# Patient Record
Sex: Female | Born: 1965 | Race: White | Hispanic: No | Marital: Married | Smoking: Former smoker
Health system: Southern US, Community
[De-identification: ages and names within clinical notes are randomized; demographics above are authoritative.]

## PROBLEM LIST (undated history)

## (undated) DIAGNOSIS — N390 Urinary tract infection, site not specified: Secondary | ICD-10-CM

## (undated) DIAGNOSIS — B019 Varicella without complication: Secondary | ICD-10-CM

## (undated) DIAGNOSIS — J302 Other seasonal allergic rhinitis: Secondary | ICD-10-CM

## (undated) DIAGNOSIS — K9041 Non-celiac gluten sensitivity: Secondary | ICD-10-CM

## (undated) DIAGNOSIS — E785 Hyperlipidemia, unspecified: Secondary | ICD-10-CM

## (undated) DIAGNOSIS — J45909 Unspecified asthma, uncomplicated: Secondary | ICD-10-CM

## (undated) DIAGNOSIS — E739 Lactose intolerance, unspecified: Secondary | ICD-10-CM

## (undated) DIAGNOSIS — R002 Palpitations: Secondary | ICD-10-CM

## (undated) DIAGNOSIS — T7840XA Allergy, unspecified, initial encounter: Secondary | ICD-10-CM

## (undated) DIAGNOSIS — R9431 Abnormal electrocardiogram [ECG] [EKG]: Secondary | ICD-10-CM

## (undated) HISTORY — DX: Abnormal electrocardiogram (ECG) (EKG): R94.31

## (undated) HISTORY — DX: Varicella without complication: B01.9

## (undated) HISTORY — DX: Hyperlipidemia, unspecified: E78.5

## (undated) HISTORY — DX: Non-celiac gluten sensitivity: K90.41

## (undated) HISTORY — DX: Urinary tract infection, site not specified: N39.0

## (undated) HISTORY — DX: Other seasonal allergic rhinitis: J30.2

## (undated) HISTORY — PX: ABDOMINAL HYSTERECTOMY: SHX81

## (undated) HISTORY — DX: Unspecified asthma, uncomplicated: J45.909

## (undated) HISTORY — DX: Allergy, unspecified, initial encounter: T78.40XA

## (undated) HISTORY — PX: LAPAROSCOPIC APPENDECTOMY: SUR753

## (undated) HISTORY — DX: Lactose intolerance, unspecified: E73.9

## (undated) HISTORY — DX: Palpitations: R00.2

## (undated) HISTORY — PX: LIPOMA EXCISION: SHX5283

---

## 1995-02-09 HISTORY — PX: ABDOMINAL HYSTERECTOMY: SHX81

## 1997-02-08 HISTORY — PX: LAPAROSCOPIC APPENDECTOMY: SUR753

## 2005-05-17 ENCOUNTER — Emergency Department (HOSPITAL_COMMUNITY): Admission: EM | Admit: 2005-05-17 | Discharge: 2005-05-17 | Payer: Self-pay | Admitting: Emergency Medicine

## 2005-05-18 ENCOUNTER — Emergency Department (HOSPITAL_COMMUNITY): Admission: EM | Admit: 2005-05-18 | Discharge: 2005-05-18 | Payer: Self-pay | Admitting: Emergency Medicine

## 2008-05-11 ENCOUNTER — Emergency Department (HOSPITAL_COMMUNITY): Admission: EM | Admit: 2008-05-11 | Discharge: 2008-05-11 | Payer: Self-pay | Admitting: Emergency Medicine

## 2009-01-11 ENCOUNTER — Emergency Department (HOSPITAL_COMMUNITY): Admission: EM | Admit: 2009-01-11 | Discharge: 2009-01-11 | Payer: Self-pay | Admitting: Emergency Medicine

## 2009-12-19 ENCOUNTER — Encounter (INDEPENDENT_AMBULATORY_CARE_PROVIDER_SITE_OTHER): Payer: Self-pay | Admitting: *Deleted

## 2009-12-22 ENCOUNTER — Encounter: Payer: Self-pay | Admitting: Cardiology

## 2010-03-01 ENCOUNTER — Encounter: Payer: Self-pay | Admitting: Otolaryngology

## 2010-03-10 NOTE — Letter (Signed)
Summary: Appointment - Missed  [facility] [place]  [address], [place] [address]   Phone: [phone]  Fax: [phone]     December 19, 2009 MRN: [id number]   JAKALA HERFORD [address] [place], [place]  [address]   Dear Ms. HARNEY,  Our records indicate you missed your appointment on      12/19/09                  with Dr.    Diona Browner         It is very important that we reach you to reschedule this appointment. We look forward to participating in your health care needs. Please contact us at the number listed above at your earliest convenience to reschedule this appointment.     Sincerely,    Glass blower/designer

## 2010-03-10 NOTE — Letter (Signed)
Summary: ,[facility] RECORDS  ,[facility]   Imported By: Faythe Ghee 12/22/2009 09:51:17  _____________________________________________________________________  External Attachment:    Type:   Image     Comment:   External Document

## 2010-05-12 LAB — COMPREHENSIVE METABOLIC PANEL
AST: 24 U/L (ref 0–37)
GFR calc Af Amer: >60 mL/min (ref >60)
Sodium: 138 mEq/L (ref 135–145)
Total Protein: 6.7 g/dL (ref 6.0–8.3)

## 2010-05-12 LAB — CBC
HCT: 38.5 % (ref 36.0–46.0)
Hemoglobin: 13.5 g/dL (ref 12.0–15.0)
MCHC: 35.1 g/dL (ref 30.0–36.0)
MCV: 89.3 fL (ref 78.0–100.0)
Platelets: 192 10*3/uL (ref 150–400)
RBC: 4.31 MIL/uL (ref 3.87–5.11)
RDW: 12.2 % (ref 11.5–15.5)
WBC: 6.4 10*3/uL (ref 4.0–10.5)

## 2010-05-12 LAB — DIFFERENTIAL
Basophils Absolute: 0 10*3/uL (ref 0.0–0.1)
Basophils Relative: 1 % (ref 0–1)
Eosinophils Absolute: 0.1 10*3/uL (ref 0.0–0.7)
Eosinophils Relative: 2 % (ref 0–5)
Lymphocytes Relative: 40 % (ref 12–46)
Lymphs Abs: 2.5 10*3/uL (ref 0.7–4.0)
Monocytes Absolute: 0.4 10*3/uL (ref 0.1–1.0)
Monocytes Relative: 6 % (ref 3–12)
Neutro Abs: 3.3 10*3/uL (ref 1.7–7.7)
Neutrophils Relative %: 52 % (ref 43–77)

## 2010-05-12 LAB — URINALYSIS, ROUTINE W REFLEX MICROSCOPIC
Glucose, UA: NEGATIVE mg/dL
Hgb urine dipstick: NEGATIVE
pH: 6.5 (ref 5.0–8.0)

## 2011-05-11 ENCOUNTER — Emergency Department (HOSPITAL_COMMUNITY)
Admission: EM | Admit: 2011-05-11 | Discharge: 2011-05-11 | Disposition: A | Discharge status: Home / Self Care | Payer: BC Managed Care – PPO | Attending: Emergency Medicine | Admitting: Emergency Medicine

## 2011-05-11 ENCOUNTER — Emergency Department (HOSPITAL_COMMUNITY): Payer: BC Managed Care – PPO

## 2011-05-11 ENCOUNTER — Other Ambulatory Visit: Payer: Self-pay

## 2011-05-11 ENCOUNTER — Encounter (HOSPITAL_COMMUNITY): Payer: Self-pay

## 2011-05-11 DIAGNOSIS — R002 Palpitations: Secondary | ICD-10-CM | POA: Insufficient documentation

## 2011-05-11 DIAGNOSIS — R9431 Abnormal electrocardiogram [ECG] [EKG]: Secondary | ICD-10-CM | POA: Insufficient documentation

## 2011-05-11 LAB — POCT I-STAT, CHEM 8
BUN: 19 mg/dL (ref 6–23)
Chloride: 105 mEq/L (ref 96–112)
Creatinine, Ser: 0.8 mg/dL (ref 0.50–1.10)
Potassium: 3.9 mEq/L (ref 3.5–5.1)
Sodium: 141 mEq/L (ref 135–145)
TCO2: 24 mmol/L (ref 0–100)

## 2011-05-11 LAB — POCT I-STAT TROPONIN I: Troponin i, poc: 0 ng/mL (ref 0.00–0.08)

## 2011-05-11 NOTE — ED Provider Notes (Signed)
History   This chart was scribed for Diana Quarry, MD by Clarita Crane. The patient was seen in room APA05/APA05. Patient's care was started at 1131.    CSN: [id number]  Arrival date & time 05/11/11  1131   First MD Initiated Contact with Patient 05/11/11 1241      Chief Complaint  Patient presents with  . Palpitations    (Consider location/radiation/quality/duration/timing/severity/associated sxs/prior treatment) HPI MEE Diana Bryant is a 46 y.o. female who presents to the Emergency Department after referral from PCP complaining of intermittent episodes of moderate palpitations described as fluttering/rapid heart beat onset several years ago and gradually worsening the past several weeks with associated SOB with episodes. Patient notes she has experienced infrequent episodes of chest pain over the past several weeks but notes this has been related with increased stress. Denies swelling of lower extremities, diaphoresis, weight loss/gain. Denies h/o diabetes, HTN and is a non smoker, non-drinker. Denies family h/o heart problems.   Past Medical History  Diagnosis Date  . Irregular heart beat     Past Surgical History  Procedure Date  . Abdominal hysterectomy     No family history on file.  History  Substance Use Topics  . Smoking status: Not on file  . Smokeless tobacco: Not on file  . Alcohol Use: No    OB History    Grav Para Term Preterm Abortions TAB SAB Ect Mult Living                  Review of Systems 10 Systems reviewed and all are negative for acute change except as noted in the HPI.   Allergies  Amoxicillin; Oxycodone-acetaminophen; and Sulfonamide derivatives  Home Medications  No current outpatient prescriptions on file.  BP 117/69  Pulse 84  Temp(Src) 98.6 F (37 C) (Oral)  Resp 16  SpO2 100%  Physical Exam  Nursing note and vitals reviewed. Constitutional: She is oriented to person, place, and time. She appears well-developed and  well-nourished. No distress.  HENT:  Head: Normocephalic and atraumatic.  Eyes: EOM are normal. Pupils are equal, round, and reactive to light.  Neck: Neck supple. No tracheal deviation present.  Cardiovascular: Normal rate and regular rhythm.  Exam reveals no gallop and no friction rub.   No murmur heard. Pulmonary/Chest: Effort normal. No respiratory distress. She has no wheezes. She has no rales.  Abdominal: Soft. She exhibits no distension.  Musculoskeletal: Normal range of motion. She exhibits no edema.  Neurological: She is alert and oriented to person, place, and time. No sensory deficit.  Skin: Skin is warm and dry.  Psychiatric: She has a normal mood and affect. Her behavior is normal.    ED Course  Procedures (including critical care time)  DIAGNOSTIC STUDIES: Oxygen Saturation is 100% on room air, normal by my interpretation.    COORDINATION OF CARE: 12:55PM- Patient informed of current plan for treatment and evaluation and agrees with plan at this time. Will refer to cardiologist.     Labs Reviewed  POCT I-STAT, CHEM 8 - Abnormal; Notable for the following:    Glucose, Bld 110 (*)    All other components within normal limits  POCT I-STAT TROPONIN I   Dg Chest 2 View  05/11/2011  *RADIOLOGY REPORT*  Clinical Data: Palpitations  CHEST - 2 VIEW  Comparison: 01/11/2009  Findings: Cardiomediastinal silhouette is stable.  No acute infiltrate or pleural effusion.  No pulmonary edema.  Bony thorax is stable.  IMPRESSION: No active disease.  No significant change.  Original Report Authenticated By: Natasha Mead, M.D.     1. Palpitations   2. Prolonged Q-T interval on ECG       MDM  I personally performed the services described in this documentation, which was scribed in my presence. The recorded information has been reviewed and considered.   Patient with palpitations but no signs or symptoms of cad.  Patient advised regarding prolonged qtc and advised close follow up  with cardiology.  She voiced understanding and agreement with plan.        Diana Quarry, MD 05/18/11 [phone]

## 2011-05-11 NOTE — Discharge Instructions (Signed)
Your QT is slightly prolonged at 481 on your EKG. This should be followed up by the cardiologist. Otherwise her workup is entirely normal. You need to see Dr. Charm Barges and have your thyroid screening. Please avoid all medications that are not prescribed and avoid all caffeine and other stimulants.

## 2011-05-11 NOTE — ED Notes (Signed)
Complain of palpations for a long time. States she spoke with her doctor and was told to come here for a referral

## 2011-05-27 ENCOUNTER — Encounter: Payer: Self-pay | Admitting: Cardiology

## 2011-05-27 ENCOUNTER — Ambulatory Visit (INDEPENDENT_AMBULATORY_CARE_PROVIDER_SITE_OTHER): Payer: BC Managed Care – PPO | Admitting: Cardiology

## 2011-05-27 VITALS — BP 117/80 | HR 72 | Ht 62.0 in | Wt 166.0 lb

## 2011-05-27 DIAGNOSIS — Z0189 Encounter for other specified special examinations: Secondary | ICD-10-CM | POA: Insufficient documentation

## 2011-05-27 DIAGNOSIS — R002 Palpitations: Secondary | ICD-10-CM | POA: Insufficient documentation

## 2011-05-27 DIAGNOSIS — R9431 Abnormal electrocardiogram [ECG] [EKG]: Secondary | ICD-10-CM | POA: Insufficient documentation

## 2011-05-27 NOTE — Patient Instructions (Addendum)
Your physician recommends that you schedule a follow-up appointment in: 3 weeks  Your physician has recommended that you wear a holter monitor. Holter monitors are medical devices that record the heart's electrical activity. Doctors most often use these monitors to diagnose arrhythmias. Arrhythmias are problems with the speed or rhythm of the heartbeat. The monitor is a small, portable device. You can wear one while you do your normal daily activities. This is usually used to diagnose what is causing palpitations/syncope (passing out).

## 2011-05-27 NOTE — Assessment & Plan Note (Signed)
Repeat EKG shows a perfectly normal QT interval.  In the absence of suggestive symptoms, a definite family history, arrhythmia or testing abnormalities, no further evaluation or treatment of this issue is warranted.

## 2011-05-27 NOTE — Progress Notes (Deleted)
Name: Diana Bryant    DOB: Jun 10, 1965  Age: 46 y.o.  MR#: [id number]       PCP:  Alcide Evener, DO      Insurance: @PAYORNAME @   CC:    Chief Complaint  Patient presents with  . Palpitations    Referral from ED for Abn EKG/Palps - No meds    VS BP 117/80  Pulse 72  Ht 5\' 2"  (1.575 m)  Wt 166 lb (75.297 kg)  BMI 30.36 kg/m2  Weights Current Weight  05/27/11 166 lb (75.297 kg)    Blood Pressure  BP Readings from Last 3 Encounters:  05/27/11 117/80  05/11/11 117/69     Admit date:  (Not on file) Last encounter with RMR:  Visit date not found   Allergy Allergies  Allergen Reactions  . Amoxicillin   . Oxycodone-Acetaminophen   . Sulfonamide Derivatives     Current Outpatient Prescriptions  Medication Sig Dispense Refill  . cetirizine (ZYRTEC) 10 MG tablet Take 10 mg by mouth daily.      Marland Kitchen NASONEX 50 MCG/ACT nasal spray prn      . NON FORMULARY CANDIZYME (powerful multi-enzyme formula) 1 tab po qd      . NON FORMULARY XCLEAR (SINUS NASAL SPRAY)        Discontinued Meds:   There are no discontinued medications.  There is no problem list on file for this patient.   LABS Admission on 05/11/2011, Discharged on 05/11/2011  Component Date Value  . Sodium 05/11/2011 141   . Potassium 05/11/2011 3.9   . Chloride 05/11/2011 105   . BUN 05/11/2011 19   . Creatinine, Ser 05/11/2011 0.80   . Glucose, Bld 05/11/2011 110*  . Calcium, Ion 05/11/2011 1.25   . TCO2 05/11/2011 24   . Hemoglobin 05/11/2011 13.6   . HCT 05/11/2011 40.0   . Troponin i, poc 05/11/2011 0.00   . Comment 3 05/11/2011             Results for this Opt Visit:     Results for orders placed during the hospital encounter of 05/11/11  POCT I-STAT, CHEM 8      Component Value Range   Sodium 141  135 - 145 (mEq/L)   Potassium 3.9  3.5 - 5.1 (mEq/L)   Chloride 105  96 - 112 (mEq/L)   BUN 19  6 - 23 (mg/dL)   Creatinine, Ser 4.09  0.50 - 1.10 (mg/dL)   Glucose, Bld 811 (*) 70 - 99  (mg/dL)   Calcium, Ion 9.14  7.82 - 1.32 (mmol/L)   TCO2 24  0 - 100 (mmol/L)   Hemoglobin 13.6  12.0 - 15.0 (g/dL)   HCT 95.6  21.3 - 08.6 (%)  POCT I-STAT TROPONIN I      Component Value Range   Troponin i, poc 0.00  0.00 - 0.08 (ng/mL)   Comment 3             EKG Orders placed during the hospital encounter of 05/11/11  . ED EKG  . ED EKG  . EKG     Prior Assessment and Plan    Imaging: Dg Chest 2 View  05/11/2011  *RADIOLOGY REPORT*  Clinical Data: Palpitations  CHEST - 2 VIEW  Comparison: 01/11/2009  Findings: Cardiomediastinal silhouette is stable.  No acute infiltrate or pleural effusion.  No pulmonary edema.  Bony thorax is stable.  IMPRESSION: No active disease.  No significant change.  Original Report Authenticated By: Lang Snow  POP, M.D.     [facility] Calculation: Score not calculated. Missing: Total Cholesterol, HDL

## 2011-05-27 NOTE — Progress Notes (Signed)
Patient ID: Diana Bryant, female   DOB: 04-28-1965, 46 y.o.   MRN: [id number]  HPI: Diana Bryant is evaluated at the kind request of Dr. Lodema Hong after assessment in the emergency department for palpitations revealed the presence of a long QT IInterval.   There is no family history of cardiac problems, sudden death, EKG abnormalities or arrhythmias; however,  Diana Bryant is her only first degree relative.  She is not in touch with her Diana Bryant nor any of his family and has one half sibling.  She has never suffered loss of consciousness, not previously undergone cardiology assessment or testing and has never been seen by a cardiologist.  She presented to the emergency department with palpitations at the request of her Diana Bryant, who is an Charity fundraiser, and was found to have a long QT interval of approximately 480 ms corrected.  Apparently, no arrhythmias were documented.  Patient reports daily palpitations are generally quite brief and not associated with other symptoms.  She does note class II dyspnea on exertion, which has been present for the past year or 2.  Current Outpatient Prescriptions on File Prior to Visit  Medication Sig Dispense Refill  . cetirizine (ZYRTEC) 10 MG tablet Take 10 mg by mouth daily.      Marland Kitchen NASONEX 50 MCG/ACT nasal spray prn       Allergies  Allergen Reactions  . Amoxicillin   . Oxycodone-Acetaminophen   . Sulfonamide Derivatives     Past Medical History  Diagnosis Date  . Palpitations     05/2011-normal CBC, normal BMet, normal EKG except borderline QT prolongation and  . Abnormal EKG     Past Surgical History  Procedure Date  . Abdominal hysterectomy     History reviewed. No pertinent family history.   History   Social History  . Marital Status: Married    Spouse Name: N/A    Number of Children: N/A  . Years of Education: N/A   Occupational History  . Not on file.   Social History Main Topics  . Smoking status: Former Games developer  . Smokeless tobacco: Not on file  .  Alcohol Use: No  . Drug Use: No  . Sexually Active: Not on file   Other Topics Concern  . Not on file   Social History Narrative  . No narrative on file    ROS: Consumption of caffeine is modest.  Minimal tobacco use it was discontinued 2 years ago, notes what appears to be excessive hair loss, intermittent sinus problems, nasal congestion, generalized weakness and fatigue.     All other systems reviewed and are negative.  PHYSICAL EXAM: BP 117/80  Pulse 72  Ht 5\' 2"  (1.575 m)  Wt 75.297 kg (166 lb)  BMI 30.36 kg/m2  General-Well-developed; no acute distress Body Habitus-Overweight HEENT-[place]/AT; PERRL; EOM intact; conjunctiva and lids nl Neck-No JVD; no carotid bruits Endocrine-No thyromegaly Lungs-Clear lung fields; resonant percussion; normal I-to-E ratio Cardiovascular- normal PMI; normal S1 and S2 Abdomen-BS normal; soft and non-tender without masses or organomegaly Musculoskeletal-No deformities, cyanosis or clubbing Neurologic-Nl cranial nerves; symmetric strength and tone Skin- Warm, no significant lesions Extremities-Nl distal pulses; no edema  EKG:  Normal sinus rhythm, minimal nondiagnostic inferolateral Q waves, otherwise normal.  Corrected QT interval is less than 450 ms.  ASSESSMENT AND PLAN:  Vallecito Bing, MD 05/27/2011 3:49 PM

## 2011-05-30 NOTE — Assessment & Plan Note (Signed)
Since symptoms are occurring on a daily or near daily basis, a 48 hour Holter recording should be adequate to document their identity.  I will reassess this nice woman once the results of that study are available.

## 2011-05-31 ENCOUNTER — Ambulatory Visit (HOSPITAL_COMMUNITY)
Admission: RE | Admit: 2011-05-31 | Discharge: 2011-05-31 | Disposition: A | Discharge status: Home / Self Care | Payer: BC Managed Care – PPO | Source: Ambulatory Visit | Attending: Cardiology | Admitting: Cardiology

## 2011-05-31 DIAGNOSIS — R002 Palpitations: Secondary | ICD-10-CM | POA: Insufficient documentation

## 2011-05-31 NOTE — Progress Notes (Signed)
*  PRELIMINARY RESULTS* Echocardiogram 48H Holter monitor has been performed.  Conrad Ravenna 05/31/2011, 10:25 AM

## 2011-06-05 ENCOUNTER — Encounter: Payer: Self-pay | Admitting: Cardiology

## 2011-06-05 NOTE — Procedures (Signed)
Diana Bryant, Diana Bryant               ACCOUNT NO.:  [id number]  MEDICAL RECORD NO.:  [id number]  LOCATION:  CARDIOPU                      FACILITY:  [facility]  PHYSICIAN:  Gerrit Friends. Dietrich Pates, MD, FACCDATE OF BIRTH:  [birth year]  DATE OF PROCEDURE:  05/31/2011 DATE OF DISCHARGE:  05/31/2011                               HOLTER MONITOR   REFERRING PHYSICIAN:  Samuel Jester, MD  CLINICAL DATA:  47 year old woman with palpitations. 1. Continuous electrocardiographic recording was maintained for 47     hours and 52 minutes, during which the predominant rhythm was     normal sinus.  Minimal sinus tachycardia to a peak heart rate of     113 BPM and modest sinus bradycardia to a nadir of 50 BPM were also identified.  First-degree AV block was present. 2. Extremely rare PVCs were identified with a total of 2 noted during     the entire recording interval.  A similar number of     supraventricular ectopic complexes were recorded, numbering 19 over     the entire record.  No significant arrhythmias identified. 3. No significant ST-segment elevation nor depression seen. 4. A detail diary of activity was returned reporting 4 episodes of     palpitations.  During each of these, normal sinus rhythm with first-     degree AV block was maintained.  IMPRESSION:  Negative Holter monitor recording revealing no significant arrhythmias and no correlation between rhythm and symptoms.     Gerrit Friends. Dietrich Pates, MD, [facility]     RMR/MEDQ  D:  06/05/2011  T:  06/05/2011  Job:  [id number]

## 2011-06-18 ENCOUNTER — Encounter: Payer: Self-pay | Admitting: Cardiology

## 2011-06-18 ENCOUNTER — Ambulatory Visit (INDEPENDENT_AMBULATORY_CARE_PROVIDER_SITE_OTHER): Payer: BC Managed Care – PPO | Admitting: Cardiology

## 2011-06-18 VITALS — BP 128/80 | HR 63 | Resp 16 | Ht 62.0 in | Wt 167.0 lb

## 2011-06-18 DIAGNOSIS — R002 Palpitations: Secondary | ICD-10-CM

## 2011-06-18 DIAGNOSIS — R9431 Abnormal electrocardiogram [ECG] [EKG]: Secondary | ICD-10-CM

## 2011-06-18 NOTE — Progress Notes (Signed)
Patient ID: Diana Bryant, female   DOB: [birth year], 46 y.o.   MRN: [id number]  HPI: Scheduled return visit for this very nice woman with a history of palpitations and a question of long QT interval.  Since her last visit, symptoms are unchanged.  She notes occasional brief palpitations but she does not find aversive and for which she is not inclined to utilize pharmacologic therapy.  Exercise tolerance remains good.  She attempted to locate an EKG for her mother, but was unable to do so.  If the tracing does exist, it was obtained many years ago, and no one in the family recalls at what facility.  Prior to Admission medications   Medication Sig Start Date End Date Taking? Authorizing Provider  cetirizine (ZYRTEC) 10 MG tablet Take 10 mg by mouth daily.   Yes Historical Provider, MD  NASONEX 50 MCG/ACT nasal spray prn 04/17/11  Yes Historical Provider, MD  NON Tomma Rakers (SINUS NASAL SPRAY)   Yes Historical Provider, MD   Allergies  Allergen Reactions  . Amoxicillin   . Oxycodone-Acetaminophen   . Sulfonamide Derivatives      Past medical history, social history, and family history reviewed and updated.  ROS: Denies chest pain or dyspnea.  PHYSICAL EXAM: BP 128/80  Pulse 63  Resp 16  Ht 5\' 2"  (1.575 m)  Wt 75.751 kg (167 lb)  BMI 30.54 kg/m2  General-Well developed; no acute distress Body habitus-Mildly overweight Neck-No JVD; no carotid bruits Lungs-clear lung fields; resonant to percussion Cardiovascular-normal PMI; normal S1 and S2; minimal systolic murmur at the left sternal border Abdomen-normal bowel sounds; soft and non-tender without masses or organomegaly Musculoskeletal-No deformities, no cyanosis or clubbing Neurologic-Normal cranial nerves; symmetric strength and tone Skin-Warm, no significant lesions Extremities-distal pulses intact; no edema  Holter monitor: Tracings obtained and reviewed.  Rare PVCs and PACs.  No correlation between symptoms and  rhythm.  ASSESSMENT AND PLAN:  Nibley Bing, MD 06/18/2011 12:09 PM

## 2011-06-18 NOTE — Progress Notes (Deleted)
**Note De-Identified Nuri Larmer Obfuscation** Name: RENLEY GUTMAN    DOB: 05/31/1965  Age: 46 y.o.  MR#: [id number]       PCP:  Alcide Evener, DO      Insurance: @PAYORNAME @   CC:    Chief Complaint  Patient presents with  . Appointment    palpitations -meds/list    VS BP 128/80  Pulse 63  Resp 16  Ht 5\' 2"  (1.575 m)  Wt 167 lb (75.751 kg)  BMI 30.54 kg/m2  Weights Current Weight  06/18/11 167 lb (75.751 kg)  05/27/11 166 lb (75.297 kg)    Blood Pressure  BP Readings from Last 3 Encounters:  06/18/11 128/80  05/27/11 117/80  05/11/11 117/69     Admit date:  (Not on file) Last encounter with RMR:  06/05/2011   Allergy Allergies  Allergen Reactions  . Amoxicillin   . Oxycodone-Acetaminophen   . Sulfonamide Derivatives     Current Outpatient Prescriptions  Medication Sig Dispense Refill  . cetirizine (ZYRTEC) 10 MG tablet Take 10 mg by mouth daily.      Marland Kitchen NASONEX 50 MCG/ACT nasal spray prn      . NON FORMULARY XCLEAR (SINUS NASAL SPRAY)        Discontinued Meds:    Medications Discontinued During This Encounter  Medication Reason  . NON FORMULARY Error    Patient Active Problem List  Diagnoses  . Palpitations  . Abnormal EKG  . Laboratory test    LABS Admission on 05/11/2011, Discharged on 05/11/2011  Component Date Value  . Sodium 05/11/2011 141   . Potassium 05/11/2011 3.9   . Chloride 05/11/2011 105   . BUN 05/11/2011 19   . Creatinine, Ser 05/11/2011 0.80   . Glucose, Bld 05/11/2011 110*  . Calcium, Ion 05/11/2011 1.25   . TCO2 05/11/2011 24   . Hemoglobin 05/11/2011 13.6   . HCT 05/11/2011 40.0   . Troponin i, poc 05/11/2011 0.00   . Comment 3 05/11/2011             Results for this Opt Visit:     Results for orders placed during the hospital encounter of 05/11/11  POCT I-STAT, CHEM 8      Component Value Range   Sodium 141  135 - 145 (mEq/L)   Potassium 3.9  3.5 - 5.1 (mEq/L)   Chloride 105  96 - 112 (mEq/L)   BUN 19  6 - 23 (mg/dL)   Creatinine, Ser 5.62  0.50 -  1.10 (mg/dL)   Glucose, Bld 130 (*) 70 - 99 (mg/dL)   Calcium, Ion 8.65  7.84 - 1.32 (mmol/L)   TCO2 24  0 - 100 (mmol/L)   Hemoglobin 13.6  12.0 - 15.0 (g/dL)   HCT 69.6  29.5 - 28.4 (%)  POCT I-STAT TROPONIN I      Component Value Range   Troponin i, poc 0.00  0.00 - 0.08 (ng/mL)   Comment 3             EKG Orders placed during the hospital encounter of 05/31/11  . HOLTER MONITOR - 48 HOUR  . HOLTER MONITOR - 48 HOUR  . HOLTER MONITOR - 48 HOUR  . HOLTER MONITOR - 48 HOUR     Prior Assessment and Plan Problem List as of 06/18/2011        Palpitations   Last Assessment & Plan Note   05/27/2011 Office Visit Signed 05/30/2011  9:05 AM by Kathlen Brunswick, MD    Since symptoms **Note De-Identified Mariselda Badalamenti Obfuscation** are occurring on a daily or near daily basis, a 48 hour Holter recording should be adequate to document their identity.  I will reassess this nice woman once the results of that study are available.    Abnormal EKG   Last Assessment & Plan Note   05/27/2011 Office Visit Signed 05/27/2011  4:08 PM by Kathlen Brunswick, MD    Repeat EKG shows a perfectly normal QT interval.  In the absence of suggestive symptoms, a definite family history, arrhythmia or testing abnormalities, no further evaluation or treatment of this issue is warranted.    Laboratory test       Imaging: No results found.   FRS Calculation: Score not calculated. Missing: Total Cholesterol, HDL

## 2011-06-18 NOTE — Assessment & Plan Note (Signed)
There is no significant evidence for long QT syndrome, and no further evaluation or treatment is warranted.

## 2011-06-18 NOTE — Patient Instructions (Signed)
Your physician recommends that you schedule a follow-up appointment in: As needed  

## 2011-06-18 NOTE — Assessment & Plan Note (Signed)
Symptoms persist, but either did not reflect arrhythmia whatsoever or are related to occasional ventricular and/or supraventricular ectopics.  No further testing nor specific therapy is warranted.  Patient is encouraged to call should she develop additional or worsening problems.  Otherwise, she does not require continuing Cardiology care.

## 2012-03-06 ENCOUNTER — Ambulatory Visit
Admission: RE | Admit: 2012-03-06 | Discharge: 2012-03-06 | Disposition: A | Discharge status: Home / Self Care | Payer: BC Managed Care – PPO | Source: Ambulatory Visit | Attending: Otolaryngology | Admitting: Otolaryngology

## 2012-03-06 ENCOUNTER — Other Ambulatory Visit: Payer: Self-pay | Admitting: Otolaryngology

## 2012-03-06 DIAGNOSIS — J329 Chronic sinusitis, unspecified: Secondary | ICD-10-CM

## 2012-04-12 ENCOUNTER — Other Ambulatory Visit: Payer: Self-pay | Admitting: Otolaryngology

## 2012-04-12 DIAGNOSIS — J329 Chronic sinusitis, unspecified: Secondary | ICD-10-CM

## 2012-04-18 ENCOUNTER — Ambulatory Visit
Admission: RE | Admit: 2012-04-18 | Discharge: 2012-04-18 | Disposition: A | Discharge status: Home / Self Care | Payer: BC Managed Care – PPO | Source: Ambulatory Visit | Attending: Otolaryngology | Admitting: Otolaryngology

## 2013-10-26 ENCOUNTER — Emergency Department (HOSPITAL_COMMUNITY): Payer: Managed Care, Other (non HMO)

## 2013-10-26 ENCOUNTER — Emergency Department (HOSPITAL_COMMUNITY)
Admission: EM | Admit: 2013-10-26 | Discharge: 2013-10-26 | Disposition: A | Discharge status: Home / Self Care | Payer: Managed Care, Other (non HMO) | Attending: Emergency Medicine | Admitting: Emergency Medicine

## 2013-10-26 ENCOUNTER — Encounter (HOSPITAL_COMMUNITY): Payer: Self-pay | Admitting: Emergency Medicine

## 2013-10-26 DIAGNOSIS — Z791 Long term (current) use of non-steroidal anti-inflammatories (NSAID): Secondary | ICD-10-CM | POA: Insufficient documentation

## 2013-10-26 DIAGNOSIS — Z79899 Other long term (current) drug therapy: Secondary | ICD-10-CM | POA: Diagnosis not present

## 2013-10-26 DIAGNOSIS — Z792 Long term (current) use of antibiotics: Secondary | ICD-10-CM | POA: Diagnosis not present

## 2013-10-26 DIAGNOSIS — Z87891 Personal history of nicotine dependence: Secondary | ICD-10-CM | POA: Insufficient documentation

## 2013-10-26 DIAGNOSIS — R0602 Shortness of breath: Secondary | ICD-10-CM | POA: Insufficient documentation

## 2013-10-26 DIAGNOSIS — J45901 Unspecified asthma with (acute) exacerbation: Secondary | ICD-10-CM | POA: Diagnosis not present

## 2013-10-26 DIAGNOSIS — Z880 Allergy status to penicillin: Secondary | ICD-10-CM | POA: Insufficient documentation

## 2013-10-26 DIAGNOSIS — J40 Bronchitis, not specified as acute or chronic: Secondary | ICD-10-CM

## 2013-10-26 MED ORDER — ALBUTEROL SULFATE HFA 108 (90 BASE) MCG/ACT IN AERS: 1.0000 | INHALATION_SPRAY | Freq: Four times a day (QID) | RESPIRATORY_TRACT | Status: DC | PRN

## 2013-10-26 MED ORDER — ONDANSETRON 8 MG PO TBDP
8.0000 mg | ORAL_TABLET | Freq: Once | ORAL | Status: AC
  Administered 2013-10-26: 8 mg via ORAL
  Filled 2013-10-26: qty 1

## 2013-10-26 MED ORDER — PREDNISONE 50 MG PO TABS: ORAL_TABLET | ORAL | Status: DC

## 2013-10-26 MED ORDER — AZITHROMYCIN 250 MG PO TABS: ORAL_TABLET | ORAL | Status: DC

## 2013-10-26 MED ORDER — IPRATROPIUM-ALBUTEROL 0.5-2.5 (3) MG/3ML IN SOLN
3.0000 mL | Freq: Once | RESPIRATORY_TRACT | Status: AC
  Administered 2013-10-26: 3 mL via RESPIRATORY_TRACT
  Filled 2013-10-26: qty 3

## 2013-10-26 MED ORDER — DIPHENHYDRAMINE HCL 50 MG/ML IJ SOLN
25.0000 mg | Freq: Once | INTRAMUSCULAR | Status: AC
  Administered 2013-10-26: 25 mg via INTRAVENOUS
  Filled 2013-10-26: qty 1

## 2013-10-26 MED ORDER — ALBUTEROL SULFATE (2.5 MG/3ML) 0.083% IN NEBU
2.5000 mg | INHALATION_SOLUTION | Freq: Once | RESPIRATORY_TRACT | Status: AC
  Administered 2013-10-26: 2.5 mg via RESPIRATORY_TRACT
  Filled 2013-10-26: qty 3

## 2013-10-26 MED ORDER — METHYLPREDNISOLONE SODIUM SUCC 125 MG IJ SOLR
125.0000 mg | Freq: Once | INTRAMUSCULAR | Status: AC
  Administered 2013-10-26: 125 mg via INTRAVENOUS
  Filled 2013-10-26: qty 2

## 2013-10-26 MED ORDER — ACETAMINOPHEN 500 MG PO TABS
1000.0000 mg | ORAL_TABLET | Freq: Once | ORAL | Status: AC
  Administered 2013-10-26: 1000 mg via ORAL
  Filled 2013-10-26: qty 2

## 2013-10-26 NOTE — ED Notes (Signed)
Pt alert & oriented x4, stable gait. Patient given discharge instructions, paperwork & prescription(s). Patient  instructed to stop at the registration desk to finish any additional paperwork. Patient verbalized understanding. Pt left department w/ no further questions. 

## 2013-10-26 NOTE — Discharge Instructions (Signed)
Chest x-ray showed no pneumonia.  Prescriptions for prednisone, inhaler, antibiotic.  Rest. Return if worse.

## 2013-10-26 NOTE — ED Provider Notes (Signed)
CSN: [id number]     Arrival date & time 10/26/13  0901 History  This chart was scribed for Nat Christen, MD by Rayfield Citizen, ED Scribe. This patient was seen in room APA02/APA02 and the patient's care was started at 9:34 AM.   Chief Complaint  Patient presents with  . Shortness of Breath   The history is provided by the patient. No language interpreter was used.    HPI Comments: Diana Bryant is a 48 y.o. female who presents to the Emergency Department via EMS complaining of 1 week of constant, gradually worsening sinus congestion with associated headache; went to PCP yesterday and was diagnosed with a sinus infection. She woke this morning with SOB, wheezing, dizziness and nausea. She notes current nausea and a sense of congestion in her throat and upper chest. After leaving her PCP yesterday, patient took Levaquin as prescribed for her sinus infection and was running a low-grade, subjective fever. Patient has a history of asthma; she utilized her inhaler this morning with no relief. Her symptoms are improved with oxygen.   Her PCP is Dr Melina Copa in [place].  Past Medical History  Diagnosis Date  . Palpitations     05/2011-normal CBC, normal BMet, normal EKG except borderline QT prolongation and  . Abnormal EKG     Long QT interval, corrected value of 480 ms   Past Surgical History  Procedure Laterality Date  . Abdominal hysterectomy     History reviewed. No pertinent family history. History  Substance Use Topics  . Smoking status: Former Research scientist (life sciences)  . Smokeless tobacco: Not on file  . Alcohol Use: No   OB History   Grav Para Term Preterm Abortions TAB SAB Ect Mult Living                 Review of Systems  A complete 10 system review of systems was obtained and all systems are negative except as noted in the HPI and PMH.    Allergies  Amoxicillin; Sulfonamide derivatives; and Oxycodone-acetaminophen  Home Medications   Prior to Admission medications   Medication Sig Start  Date End Date Taking? Authorizing Provider  albuterol (PROVENTIL HFA;VENTOLIN HFA) 108 (90 BASE) MCG/ACT inhaler Inhale 2 puffs into the lungs every 6 (six) hours as needed for wheezing or shortness of breath.   Yes Historical Provider, MD  fluconazole (DIFLUCAN) 150 MG tablet Take 1 tablet by mouth once. 10/25/13  Yes Historical Provider, MD  HYDROcodone-homatropine (HYCODAN) 5-1.5 MG/5ML syrup Take 5-10 mLs by mouth every 6 (six) hours as needed for cough.   Yes Historical Provider, MD  ibuprofen (ADVIL,MOTRIN) 200 MG tablet Take 600 mg by mouth daily as needed for headache.   Yes Historical Provider, MD  levofloxacin (LEVAQUIN) 750 MG tablet Take 750 mg by mouth daily. For 10 days   Yes Historical Provider, MD  Phenylephrine HCl (NASAL DECONGESTANT NA) Place 1 spray into the nose daily as needed (congestion).   Yes Historical Provider, MD  saccharomyces boulardii (FLORASTOR) 250 MG capsule Take 250 mg by mouth once.   Yes Historical Provider, MD  albuterol (PROVENTIL HFA;VENTOLIN HFA) 108 (90 BASE) MCG/ACT inhaler Inhale 1-2 puffs into the lungs every 6 (six) hours as needed for wheezing or shortness of breath. 10/26/13   Nat Christen, MD  azithromycin (ZITHROMAX Z-PAK) 250 MG tablet 2 po day one, then 1 daily x 4 days 10/26/13   Nat Christen, MD  predniSONE (DELTASONE) 50 MG tablet One daily for 6 days 10/26/13  Nat Christen, MD   BP 105/66  Pulse 82  Temp(Src) 97.8 F (36.6 C) (Oral)  Resp 16  Ht 5\' 2"  (1.575 m)  Wt 160 lb (72.576 kg)  BMI 29.26 kg/m2  SpO2 98% Physical Exam  Nursing note and vitals reviewed. Constitutional: She is oriented to person, place, and time. She appears well-developed and well-nourished.  HENT:  Head: Normocephalic and atraumatic.  Right Ear: Tympanic membrane and ear canal normal.  Left Ear: Tympanic membrane and ear canal normal.  Mouth/Throat: Oropharynx is clear and moist. No oropharyngeal exudate, posterior oropharyngeal edema, posterior oropharyngeal erythema  or tonsillar abscesses.  Eyes: Conjunctivae and EOM are normal. Pupils are equal, round, and reactive to light.  Neck: Normal range of motion. Neck supple.  Cardiovascular: Normal rate, regular rhythm and normal heart sounds.   Pulmonary/Chest: Effort normal and breath sounds normal. No respiratory distress. She has no wheezes. She has no rales.  Abdominal: Soft. Bowel sounds are normal.  Musculoskeletal: Normal range of motion.  Neurological: She is alert and oriented to person, place, and time.  Skin: Skin is warm and dry.  Psychiatric: She has a normal mood and affect. Her behavior is normal.    ED Course  Procedures  DIAGNOSTIC STUDIES: Oxygen Saturation is 100% on 2L [place], normal by my interpretation.    COORDINATION OF CARE: 9:41 AM Discussed treatment plan with pt at bedside; patient will receive a breathing treatment, a chest x-ray, IV fluids and anti-nausea meds. Patient agreed to plan.   Labs Review Labs Reviewed - No data to display  Imaging Review No results found.   EKG Interpretation   Date/Time:  Friday October 26 2013 09:07:30 EDT Ventricular Rate:  73 PR Interval:  183 QRS Duration: 96 QT Interval:  436 QTC Calculation: 480 R Axis:   57 Text Interpretation:  Sinus rhythm Confirmed by Mar Walmer  MD, Abigail Marsiglia ([id number]) on  10/26/2013 9:15:26 AM      MDM   Final diagnoses:  Bronchitis   history and physical most consistent with bronchitis. Chest x-ray shows no pneumonia. Discharge medications Zithromax, prednisone, albuterol inhaler.   I personally performed the services described in this documentation, which was scribed in my presence. The recorded information has been reviewed and is accurate.      Nat Christen, MD 10/30/13 1901

## 2013-10-26 NOTE — ED Notes (Signed)
Pt states she is able to swallow and breathe freely but her throat is dry and "feels thick". Pt states nausea is a little better after the zofran that was given by EMS, pt states the O2 makes her feel better. Room sats are 100% on RA, placed on O2 [place] 2L for pt comfort.

## 2013-10-26 NOTE — ED Notes (Addendum)
Per EMS, pt co SOB and nausea x2 days, pt has productive cough with green to yellow sputum, pt given 4mg  zofran IV en route by EMS. Pt on 4L [place] on arrival. Pt states she felt dizzy when she woke up, felt SOB, diaphoretic, sinus drainage, and felt nauseated. Pt paced on levaquin 750mg  PO for URI yesterday by PCP, pt has taken the med before without reaction.

## 2015-11-24 ENCOUNTER — Encounter: Payer: Self-pay | Admitting: Internal Medicine

## 2015-12-02 ENCOUNTER — Ambulatory Visit (INDEPENDENT_AMBULATORY_CARE_PROVIDER_SITE_OTHER): Payer: 59 | Admitting: Nurse Practitioner

## 2015-12-02 ENCOUNTER — Other Ambulatory Visit: Payer: Self-pay

## 2015-12-02 ENCOUNTER — Encounter: Payer: Self-pay | Admitting: Nurse Practitioner

## 2015-12-02 DIAGNOSIS — K649 Unspecified hemorrhoids: Secondary | ICD-10-CM | POA: Diagnosis not present

## 2015-12-02 HISTORY — DX: Unspecified hemorrhoids: K64.9

## 2015-12-02 MED ORDER — SOD PICOSULFATE-MAG OX-CIT ACD 10-3.5-12 MG-GM-GM PO PACK: 1.0000 | PACK | ORAL | 0 refills | Status: DC

## 2015-12-02 MED ORDER — HYDROCORTISONE ACETATE 25 MG RE SUPP: 25.0000 mg | Freq: Two times a day (BID) | RECTAL | 1 refills | Status: DC

## 2015-12-02 NOTE — Patient Instructions (Signed)
PA# for TCS: [id number]

## 2015-12-02 NOTE — Assessment & Plan Note (Addendum)
The patient has had hemorrhoids since childbirth but has not had any symptomatic presentation and many many years. She did have significant flare up of her hemorrhoids which she described as "quite uncomfortable". However, her symptoms have essentially subsided other than some mild fullness and that she can physically still feel the hemorrhoid when she wipes. She did have one episode of scant toilet tissue hematochezia and none since. The patient was reluctant for a rectal exam today, especially given the need for colonoscopy. At this point she is also due for first-ever screening colonoscopy. I will refill her Anusol suppositories to leave on hold it the pharmacy in case she should need them. We will proceed with colonoscopy at this time and return for follow-up in 3 months.  I have reviewed other hemorrhoid treatment options besides topical therapy including surgery versus hemorrhoid banding depending on her hemorrhoid presentation. These could be options that could be further explored if she has frequent recurrences or desires hemorrhoid eradication.  Proceed with TCS with Dr. Gala Romney in near future: the risks, benefits, and alternatives have been discussed with the patient in detail. The patient states understanding and desires to proceed.  The patient is not on any anticoagulants, anxiolytics, chronic pain medications, or antidepressants. Rare alcohol use, denies drug use. Conscious sedation should be adequate for her procedure.

## 2015-12-02 NOTE — Progress Notes (Signed)
cc'ed to pcp °

## 2015-12-02 NOTE — Progress Notes (Signed)
Primary Care Physician:  Octavio Graves, DO Primary Gastroenterologist:  Dr. Gala Romney  Chief Complaint  Patient presents with  . Hemorrhoids    x 5 weeks, softer than it was. Small amt of bleeding at first, none now    HPI:   Diana Bryant is a 50 y.o. female who presents On referral from primary care for "thrombosed hemorrhoid." PCP phone note reviewed. It appears the patient was also given a perception for Anusol suppository. No colonoscopy nodes found in our system.  Today she states she's doing well overall. She states she last had hemorrhoid issues with childbirth. She had a recurrence about 6 weeks ago when she had a stomach bug and frequent loose stools. Pain was quite uncomfortable. She did have some scant toilet tissue hematochezia one time, no further recurrence. She has used Anusol suppositories for 2 weeks, initially didn't help. Since then she feels they have significantly improved and shrank down. Pain is gone, can still feel the hemorrhoid when she wipes. She does have some minimal associated pressure. Has never had a colonoscope before. Denies abdominal pain, N/V, melena. Is having more frequent, loose stools. Tends to have loose stools about 2-3 times a day since her gastroenteritis. This is a significant change for her. She is gluten and dairy free which helped her previous chronic "stomach issues." Denies fever, chills, unintentional weight loss. Denies chest pain, dyspnea, dizziness, lightheadedness, syncope, near syncope. Denies any other upper or lower GI symptoms.  Past Medical History:  Diagnosis Date  . Abnormal EKG    Long QT interval, corrected value of 480 ms  . Lactose intolerance   . Non-celiac gluten sensitivity   . Palpitations    05/2011-normal CBC, normal BMet, normal EKG except borderline QT prolongation and  . Seasonal allergies     Past Surgical History:  Procedure Laterality Date  . ABDOMINAL HYSTERECTOMY    . LAPAROSCOPIC APPENDECTOMY       Current Outpatient Prescriptions  Medication Sig Dispense Refill  . albuterol (PROVENTIL HFA;VENTOLIN HFA) 108 (90 BASE) MCG/ACT inhaler Inhale 1-2 puffs into the lungs every 6 (six) hours as needed for wheezing or shortness of breath. 1 Inhaler 0  . fluticasone (FLONASE) 50 MCG/ACT nasal spray Place 1 spray into both nostrils 2 (two) times daily.    Marland Kitchen ibuprofen (ADVIL,MOTRIN) 200 MG tablet Take 600 mg by mouth daily as needed for headache.     No current facility-administered medications for this visit.     Allergies as of 12/02/2015 - Review Complete 12/02/2015  Allergen Reaction Noted  . Amoxicillin Anaphylaxis 12/12/2009  . Levaquin [levofloxacin in d5w] Anaphylaxis 12/02/2015  . Sulfonamide derivatives Anaphylaxis and Hives 12/12/2009  . Oxycodone-acetaminophen Hives 12/12/2009    Family History  Problem Relation Age of Onset  . Colon cancer Neg Hx     Social History   Social History  . Marital status: Married    Spouse name: N/A  . Number of children: N/A  . Years of education: N/A   Occupational History  . Not on file.   Social History Main Topics  . Smoking status: Former Smoker    Start date: 12/01/2001    Quit date: 12/01/2005  . Smokeless tobacco: Never Used  . Alcohol use Yes     Comment: Rarely  . Drug use: No  . Sexual activity: Not on file   Other Topics Concern  . Not on file   Social History Narrative  . No narrative on file  Review of Systems: General: Negative for anorexia, weight loss, fever, chills, fatigue, weakness. ENT: Negative for hoarseness, difficulty swallowing. CV: Negative for chest pain, angina, palpitations, peripheral edema.  Respiratory: Negative for dyspnea at rest, cough, sputum, wheezing.  GI: See history of present illness. MS: Negative for joint pain, low back pain.  Derm: Negative for rash or itching.  Endo: Negative for unusual weight change.  Heme: Negative for bruising or bleeding. Allergy: Negative for  rash or hives.    Physical Exam: BP 129/87   Pulse 74   Temp 97.6 F (36.4 C) (Oral)   Ht 5' 2.5" (1.588 m)   Wt 164 lb 3.2 oz (74.5 kg)   BMI 29.55 kg/m  General:   Alert and oriented. Pleasant and cooperative. Well-nourished and well-developed. No acute distress or obvious significant discomfort. Head:  Normocephalic and atraumatic. Eyes:  Without icterus, sclera clear and conjunctiva pink.  Ears:  Normal auditory acuity. Cardiovascular:  S1, S2 present without murmurs appreciated. Extremities without clubbing or edema. Respiratory:  Clear to auscultation bilaterally. No wheezes, rales, or rhonchi. No distress.  Gastrointestinal:  +BS, soft, non-tender and non-distended. No HSM noted. No guarding or rebound. No masses appreciated.  Rectal:  Deferred  Musculoskalatal:  Symmetrical without gross deformities. Neurologic:  Alert and oriented x4;  grossly normal neurologically. Psych:  Alert and cooperative. Normal mood and affect. Heme/Lymph/Immune: No excessive bruising noted.    12/02/2015 9:17 AM   Disclaimer: This note was dictated with voice recognition software. Similar sounding words can inadvertently be transcribed and may not be corrected upon review.

## 2015-12-02 NOTE — Patient Instructions (Signed)
1. We will schedule your procedure for you. 2. Further recommendations to be made based on the results of your procedure. 3. I sent in Anusol suppositories to your pharmacy and requested a be held into you call and ask for them. 4. Return for follow-up in 3 months.

## 2015-12-18 ENCOUNTER — Telehealth: Payer: Self-pay

## 2015-12-18 NOTE — Telephone Encounter (Signed)
-----   Message from Carlis Stable, NP sent at 12/18/2015  1:37 PM EST ----- Thanks for your help!  Ginger, can you call and notify the patient.  Camille: Mardelle Matte  ----- Message ----- From: Pascal Lux Sent: 12/18/2015  12:39 PM To: Carlis Stable, NP  Patient must be free of all GI issues for it to be screening. Since she had bleeding it must be diagnostic not screening. Also change in stool would warrant diagnostic. For it to be screening it can only be screening only no mention of other issues. Barb ----- Message ----- From: Carlis Stable, NP Sent: 12/18/2015  11:35 AM To: Murlean Iba [facility], this patient is disagreeing with diagnostic status of her colonoscopy.   Note summary: I saw the patient in office on referral for hemorrhoids and bleeding. Bleeding had stopped by the time I saw her. Was also having change in stools every since having gastroenteritis a month prior (which commonly happens after having gastroenteritis). I recommended colonoscopy because she's 37 and due anyway and that would also allow to check for any other etiology behind her bleeding and accomplish cancer screening.  Please see my note for more details.  My though is this has to be diagnostic based on the charting, but can you weigh in from a coding perspective?  Thanks!!!!!! Randall Hiss

## 2015-12-18 NOTE — Telephone Encounter (Signed)
Pt is aware and she would like to cancel her TCS for now. She can not afford the pay the $800.00

## 2015-12-19 ENCOUNTER — Ambulatory Visit (HOSPITAL_COMMUNITY): Admission: RE | Admit: 2015-12-19 | Payer: 59 | Source: Ambulatory Visit | Admitting: Internal Medicine

## 2015-12-19 ENCOUNTER — Encounter (HOSPITAL_COMMUNITY): Admission: RE | Payer: Self-pay | Source: Ambulatory Visit

## 2015-12-19 SURGERY — COLONOSCOPY
Anesthesia: Moderate Sedation

## 2015-12-19 NOTE — Telephone Encounter (Signed)
Noted  

## 2016-02-09 HISTORY — PX: LIPOMA EXCISION: SHX5283

## 2016-03-03 ENCOUNTER — Ambulatory Visit: Payer: 59 | Admitting: Nurse Practitioner

## 2016-04-16 LAB — HM MAMMOGRAPHY

## 2016-04-21 DIAGNOSIS — E559 Vitamin D deficiency, unspecified: Secondary | ICD-10-CM | POA: Insufficient documentation

## 2016-04-21 DIAGNOSIS — E785 Hyperlipidemia, unspecified: Secondary | ICD-10-CM | POA: Insufficient documentation

## 2016-04-21 DIAGNOSIS — R768 Other specified abnormal immunological findings in serum: Secondary | ICD-10-CM | POA: Insufficient documentation

## 2016-04-21 DIAGNOSIS — Z8639 Personal history of other endocrine, nutritional and metabolic disease: Secondary | ICD-10-CM | POA: Insufficient documentation

## 2016-04-21 DIAGNOSIS — M47816 Spondylosis without myelopathy or radiculopathy, lumbar region: Secondary | ICD-10-CM | POA: Insufficient documentation

## 2016-04-21 DIAGNOSIS — J322 Chronic ethmoidal sinusitis: Secondary | ICD-10-CM | POA: Insufficient documentation

## 2016-04-21 DIAGNOSIS — K9041 Non-celiac gluten sensitivity: Secondary | ICD-10-CM | POA: Insufficient documentation

## 2016-04-21 DIAGNOSIS — D171 Benign lipomatous neoplasm of skin and subcutaneous tissue of trunk: Secondary | ICD-10-CM | POA: Insufficient documentation

## 2016-04-21 DIAGNOSIS — Z87442 Personal history of urinary calculi: Secondary | ICD-10-CM | POA: Insufficient documentation

## 2016-04-21 HISTORY — DX: Personal history of urinary calculi: Z87.442

## 2016-04-21 NOTE — Progress Notes (Signed)
Office Visit Note  Patient: Diana Bryant             Date of Birth: 06-03-1965           MRN: [id number]             PCP: Octavio Graves, DO Referring: Octavio Graves, DO Visit Date: 04/26/2016 Occupation: Self employed for [employer]    Subjective:  Polyarthralgia   History of Present Illness: Diana Bryant is a 51 y.o. female  seen in consultation by her PCP for evaluation of positive ANA. According to patient and she was 28 she developed pain in her bilateral hips. She recalls that during the time she had a vaccination and soon after she started having joint pain involving multiple joints. She recalls pain in her shoulders, hands, and hip joints. She was seen by her rheumatologist in [place] state who diagnosed her with autoimmune disease and started her on methotrexate. She states she took methotrexate for 6 months and then discontinued it. She's also had some lower back discomfort which flares off and on. She continues to have discomfort in her shoulders, hip joints, and in her hands but is manageable. She denies any joint swelling. She recently had labs in by her PCP which showed positive ANA. Her mother also has lupus. She has had problems with recurrent sinusitis. She had been on gluten-free diet for 5 years and dairy free for 3 years now which and noticed improvement in her chronic sinusitis and arthralgias.  Activities of Daily Living:  Patient reports morning stiffness for 5 minutes.   Patient Denies nocturnal pain.  Difficulty dressing/grooming: Denies Difficulty climbing stairs: Denies Difficulty getting out of chair: Denies Difficulty using hands for taps, buttons, cutlery, and/or writing: Denies   Review of Systems  Constitutional: Positive for fatigue and weight gain. Negative for night sweats, weight loss and weakness.  HENT: Negative for mouth sores, trouble swallowing, trouble swallowing, mouth dryness and nose dryness.   Eyes: Negative for pain, redness,  visual disturbance and dryness.  Respiratory: Negative for cough, shortness of breath and difficulty breathing.   Cardiovascular: Negative for chest pain, palpitations, hypertension, irregular heartbeat and swelling in legs/feet.  Gastrointestinal: Negative for blood in stool, constipation and diarrhea.  Endocrine: Negative for increased urination.  Genitourinary: Negative for vaginal dryness.  Musculoskeletal: Positive for arthralgias, joint pain and morning stiffness. Negative for joint swelling, myalgias, muscle weakness, muscle tenderness and myalgias.  Skin: Negative for color change, rash, hair loss, skin tightness, ulcers and sensitivity to sunlight.  Allergic/Immunologic: Negative for susceptible to infections.  Neurological: Negative for dizziness, memory loss and night sweats.  Hematological: Negative for swollen glands.  Psychiatric/Behavioral: Negative for depressed mood and sleep disturbance. The patient is not nervous/anxious.     PMFS History:  Patient Active Problem List   Diagnosis Date Noted  . Positive ANA (antinuclear antibody) 04/21/2016  . 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 Dyslipidemia 04/21/2016  . History of kidney stones 04/21/2016  . 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 Chronic ethmoidal sinusitis 04/21/2016  . Vitamin D deficiency 04/21/2016  . Hemorrhoids 12/02/2015  . Laboratory test 05/27/2011  . Palpitations   . Abnormal EKG     Past Medical History:  Diagnosis Date  . Abnormal EKG    Long QT interval, corrected value of 480 ms  . Lactose intolerance   . Non-celiac gluten sensitivity   . Palpitations    05/2011-normal CBC, normal BMet, normal  EKG except borderline QT prolongation and  . Seasonal allergies     Family History  Problem Relation Age of Onset  . Colon cancer Neg Hx    Past Surgical History:  Procedure Laterality Date  . ABDOMINAL HYSTERECTOMY    . LAPAROSCOPIC APPENDECTOMY     Social History     Social History Narrative  . No narrative on file     Objective: Vital Signs: BP 122/76   Pulse 78   Resp 14   Ht 5\' 5"  (1.651 m)   Wt 162 lb (73.5 kg)   BMI 26.96 kg/m    Physical Exam  Constitutional: She is oriented to person, place, and time. She appears well-developed and well-nourished.  HENT:  Head: Normocephalic and atraumatic.  Eyes: Conjunctivae and EOM are normal.  Neck: Normal range of motion.  Cardiovascular: Normal rate, regular rhythm, normal heart sounds and intact distal pulses.   Pulmonary/Chest: Effort normal and breath sounds normal.  Abdominal: Soft. Bowel sounds are normal. There is tenderness.  Soft tissue tenderness noted on her left rib cage area most likely lipoma  Lymphadenopathy:    She has no cervical adenopathy.  Neurological: She is alert and oriented to person, place, and time.  Skin: Skin is warm and dry. Capillary refill takes less than 2 seconds.  Palpable small rubbery nodule noted on her left third finger  Psychiatric: She has a normal mood and affect. Her behavior is normal.  Nursing note and vitals reviewed.    Musculoskeletal Exam: C-spine, thoracic, lumbar spine good range of motion. She is some discomfort range of motion of her shoulder joints but she had good range of motion. Elbow joints wrist joint MCPs PIPs DIPs with good range of motion with no synovitis. Hip joints knee joints ankles MTPs PIPs DIPs are good range of motion with no synovitis. She tenderness on palpation of bilateral trochanteric bursa area consistent with trochanteric bursitis.  CDAI Exam: No CDAI exam completed.    Investigation: No additional findings.   Imaging: No results found.  Speciality Comments: No specialty comments available.    Procedures:  No procedures performed Allergies: Amoxicillin; Levaquin [levofloxacin in d5w]; Sulfonamide derivatives; Contrast media [iodinated diagnostic agents]; and Oxycodone-acetaminophen   Assessment /  Plan:     Visit Diagnoses: Positive ANA (antinuclear antibody) - Plan: Urinalysis, Routine w reflex microscopic, [id number] ENA PANEL, C3 and C4. Besides arthralgia she does not have any other clinical features of autoimmune disease.  Polyarthralgia: She had no synovitis on examination today.  Pain in both hands - Plan: Sedimentation rate, ANA, Rheumatoid factor, Cyclic citrul peptide antibody, IgG. Ultrasound examination of bilateral hands obtained today did not show any synovitis or tenosynovitis. Bilateral median nerves were also within normal limits. Patient to call me back if she develops any new symptoms. I'll call her back with the lab results once available.  Family history of lupus erythematosus - mother. I did discuss association of positive ANA and family members of lupus.  Nodule of finger of left hand: I will advised her to schedule an appointment with the surgeon and have it resected and send for pathology evaluation  Other fatigue - Plan: CBC with Differential/Platelet, COMPLETE METABOLIC PANEL WITH GFR  DDD lumbar spine: Minor discomfort  Trochanteric bursitis of both hips: She continues to have discomfort in her trochanteric area for which ITB and exercise were demonstrated in the office and handout was given.  Left upper quadrant pain: Probably related to lipoma. I've advised her to schedule  an appointment with surgeon.  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 CBC with Differential/Platelet  . COMPLETE METABOLIC PANEL WITH GFR  . Urinalysis, Routine w reflex microscopic  . Sedimentation rate  . ANA  . Rheumatoid factor  . Cyclic citrul peptide antibody, IgG  . [id number] ENA PANEL  . C3 and C4   No orders of the defined types were placed in this encounter.   Face-to-face time spent with patient was 50 minutes. 50% of time was spent in counseling and coordination  of care.  Follow-Up Instructions: No Follow-up on file.   Bo Merino, MD  Note - This record has been created using Editor, commissioning.  Chart creation errors have been sought, but may not always  have been located. Such creation errors do not reflect on  the standard of medical care.

## 2016-04-26 ENCOUNTER — Ambulatory Visit (INDEPENDENT_AMBULATORY_CARE_PROVIDER_SITE_OTHER): Payer: 59 | Admitting: Rheumatology

## 2016-04-26 ENCOUNTER — Encounter: Payer: Self-pay | Admitting: Rheumatology

## 2016-04-26 ENCOUNTER — Inpatient Hospital Stay (INDEPENDENT_AMBULATORY_CARE_PROVIDER_SITE_OTHER): Payer: 59

## 2016-04-26 VITALS — BP 122/76 | HR 78 | Resp 14 | Ht 65.0 in | Wt 162.0 lb

## 2016-04-26 DIAGNOSIS — R1012 Left upper quadrant pain: Secondary | ICD-10-CM

## 2016-04-26 DIAGNOSIS — R2232 Localized swelling, mass and lump, left upper limb: Secondary | ICD-10-CM

## 2016-04-26 DIAGNOSIS — E785 Hyperlipidemia, unspecified: Secondary | ICD-10-CM

## 2016-04-26 DIAGNOSIS — M79641 Pain in right hand: Secondary | ICD-10-CM | POA: Diagnosis not present

## 2016-04-26 DIAGNOSIS — M79642 Pain in left hand: Secondary | ICD-10-CM

## 2016-04-26 DIAGNOSIS — Z840 Family history of diseases of the skin and subcutaneous tissue: Secondary | ICD-10-CM

## 2016-04-26 DIAGNOSIS — M2550 Pain in unspecified joint: Secondary | ICD-10-CM | POA: Diagnosis not present

## 2016-04-26 DIAGNOSIS — M47816 Spondylosis without myelopathy or radiculopathy, lumbar region: Secondary | ICD-10-CM | POA: Diagnosis not present

## 2016-04-26 DIAGNOSIS — M7061 Trochanteric bursitis, right hip: Secondary | ICD-10-CM | POA: Diagnosis not present

## 2016-04-26 DIAGNOSIS — R5383 Other fatigue: Secondary | ICD-10-CM

## 2016-04-26 DIAGNOSIS — R768 Other specified abnormal immunological findings in serum: Secondary | ICD-10-CM

## 2016-04-26 DIAGNOSIS — M7062 Trochanteric bursitis, left hip: Secondary | ICD-10-CM

## 2016-04-26 DIAGNOSIS — K9041 Non-celiac gluten sensitivity: Secondary | ICD-10-CM

## 2016-04-26 LAB — COMPLETE METABOLIC PANEL WITH GFR
ALT: 20 U/L (ref 6–29)
AST: 16 U/L (ref 10–35)
Albumin: 4.6 g/dL (ref 3.6–5.1)
Alkaline Phosphatase: 48 U/L (ref 33–130)
BUN: 16 mg/dL (ref 7–25)
CHLORIDE: 105 mmol/L (ref 98–110)
CO2: 25 mmol/L (ref 20–31)
Calcium: 9.7 mg/dL (ref 8.6–10.4)
Creat: 0.7 mg/dL (ref 0.50–1.05)
GFR, Est African American: >89 mL/min (ref >=60)
GFR, Est Non African American: >89 mL/min (ref >=60)
Glucose, Bld: 86 mg/dL (ref 65–99)
POTASSIUM: 4.3 mmol/L (ref 3.5–5.3)
Sodium: 140 mmol/L (ref 135–146)
Total Bilirubin: 0.4 mg/dL (ref 0.2–1.2)
Total Protein: 6.8 g/dL (ref 6.1–8.1)

## 2016-04-26 LAB — CBC WITH DIFFERENTIAL/PLATELET
Basophils Absolute: 0 cells/uL (ref 0–200)
Basophils Relative: 0 %
EOS ABS: 118 {cells}/uL (ref 15–500)
Eosinophils Relative: 2 %
HEMATOCRIT: 42.9 % (ref 35.0–45.0)
Hemoglobin: 14.1 g/dL (ref 11.7–15.5)
Lymphocytes Relative: 47 %
Lymphs Abs: 2773 cells/uL (ref 850–3900)
MCH: 30.2 pg (ref 27.0–33.0)
MCHC: 32.9 g/dL (ref 32.0–36.0)
MCV: 91.9 fL (ref 80.0–100.0)
MONO ABS: 354 {cells}/uL (ref 200–950)
MPV: 9.9 fL (ref 7.5–12.5)
Monocytes Relative: 6 %
NEUTROS PCT: 45 %
Neutro Abs: 2655 cells/uL (ref 1500–7800)
Platelets: 213 10*3/uL (ref 140–400)
RBC: 4.67 MIL/uL (ref 3.80–5.10)
RDW: 13.1 % (ref 11.0–15.0)
WBC: 5.9 10*3/uL (ref 3.8–10.8)

## 2016-04-26 NOTE — Patient Instructions (Signed)
Iliotibial Bursitis Rehab Ask your health care provider which exercises are safe for you. Do exercises exactly as told by your health care provider and adjust them as directed. It is normal to feel mild stretching, pulling, tightness, or discomfort as you do these exercises, but you should stop right away if you feel sudden pain or your pain gets worse.Do not begin these exercises until told by your health care provider. Stretching and range of motion exercises These exercises warm up your muscles and joints and improve the movement and flexibility of your leg. These exercises also help to relieve pain and stiffness. Exercise A: Quadriceps stretch, prone   1. Lie on your abdomen on a firm surface, such as a bed or padded floor. 2. Bend your left / right knee and hold your ankle. If you cannot reach your ankle or pant leg, loop a belt around your foot and grab the belt instead. 3. Gently pull your heel toward your buttocks. Your knee should not slide out to the side. You should feel a stretch in the front of your thigh and knee. 4. Hold this position for __________ seconds. Repeat __________ times. Complete this exercise __________ times a day. Exercise B: Lunge (  adductor stretch) 1. Stand and spread your legs about 3 feet (about 1 m) apart. Put your left / right leg slightly back for balance. 2. Lean away from your left / right leg by bending your other knee and shifting your weight toward your bent knee. You may rest your hands on your thigh for balance. You should feel a stretch in your left / right inner thigh. 3. Hold for __________ seconds. Repeat __________ times. Complete this exercise __________ times a day. Exercise C: Hamstring stretch, supine  1. Lie on your back. 2. Hold both ends of a belt or towel as you loop it over the ball of your left / right foot. The ball of your foot is on the walking surface, right under your toes. 3. Straighten your left / right knee and slowly pull on  the belt to raise your leg. Stop when you feel a gentle stretch in the back of your left / right knee or thigh.  Do not let your left / right knee bend.  Keep your other leg flat on the floor. 4. Hold this position for __________ seconds. Repeat __________ times. Complete this exercise __________ times a day. Strengthening exercises These exercises build strength and endurance in your leg. Endurance is the ability to use your muscles for a long time, even after they get tired. Exercise D: Quadriceps wall slides  1. Lean your back against a smooth wall or door while you walk your feet out 18-24 inches (46-61 cm) from it. 2. Place your feet hip-width apart. 3. Slowly slide down the wall or door until your knees bend as far as told by your health care provider. Keep your knees over your heels, not your toes. Keep your knees in line with your hips. 4. Hold for __________ seconds. 5. Push through your heels to stand up to rest for __________ seconds after each repetition. Repeat __________ times. Complete this exercise __________ times a day. Exercise E: Straight leg raises ( hip abductors) 1. Lie on your side, with your left / right leg in the top position. Lie so your head, shoulder, knee, and hip line up with each other. You may bend your bottom knee to help you balance. 2. Lift your top leg 4-6 inches (10-15 cm) while   keeping your toes pointed straight ahead. 3. Hold this position for __________ seconds. 4. Slowly lower your leg to the starting position. Allow your muscles to relax completely after each repetition. Repeat __________ times. Complete this exercise __________ times a day. Exercise F: Straight leg raises ( hip extensors) 1. Lie on your abdomen on a firm surface. You can put a pillow under your hips if that is more comfortable. 2. Tense the muscles in your buttocks and lift your left / right leg about 4-6 inches (10-15 cm). Keep your knee straight as you lift your leg. 3. Hold  this position for __________ seconds. 4. Slowly lower your leg to the starting position. 5. Let your leg relax completely after each repetition. Repeat __________ times. Complete this exercise __________ times a day. Exercise G: Bridge ( hip extensors) 1. Lie on your back on a firm surface with your knees bent and your feet flat on the floor. 2. Tighten your buttocks muscles and lift your bottom off the floor until your trunk is level with your thighs.  Do not arch your back.  You should feel the muscles working in your buttocks and the back of your thighs. If you do not feel these muscles, slide your feet 1-2 inches (2.5-5 cm) farther away from your buttocks. 3. Hold this position for __________ seconds. 4. Slowly lower your hips to the starting position. 5. Let your buttocks muscles relax completely between repetitions. 6. If this exercise is too easy, try doing it with your arms crossed over your chest. Repeat __________ times. Complete this exercise __________ times a day. This information is not intended to replace advice given to you by your health care provider. Make sure you discuss any questions you have with your health care provider. Document Released: 01/25/2005 Document Revised: 10/02/2015 Document Reviewed: 01/07/2015 Elsevier Interactive Patient Education  2017 [employer]  

## 2016-04-27 LAB — SEDIMENTATION RATE: SED RATE: 4 mm/h (ref 0–20)

## 2016-04-27 LAB — URINALYSIS, ROUTINE W REFLEX MICROSCOPIC
Bilirubin Urine: NEGATIVE
Glucose, UA: NEGATIVE
Hgb urine dipstick: NEGATIVE
KETONES UR: NEGATIVE
Leukocytes, UA: NEGATIVE
NITRITE: NEGATIVE
Protein, ur: NEGATIVE
SPECIFIC GRAVITY, URINE: 1.009 (ref 1.001–1.035)
pH: 6 (ref 5.0–8.0)

## 2016-04-27 LAB — CP5000020 ENA PANEL
ENA SM Ab Ser-aCnc: <1
Ribonucleic Protein(ENA) Antibody, IgG: <1
SSA (Ro) (ENA) Antibody, IgG: <1
SSB (LA) (ENA) ANTIBODY, IGG: POSITIVE — AB
Scleroderma (Scl-70) (ENA) Antibody, IgG: <1
ds DNA Ab: 1 IU/mL

## 2016-04-27 LAB — CYCLIC CITRUL PEPTIDE ANTIBODY, IGG

## 2016-04-27 LAB — RHEUMATOID FACTOR: Rhuematoid fact SerPl-aCnc: <14 IU/mL (ref <14)

## 2016-04-27 LAB — C3 AND C4
C3 COMPLEMENT: 135 mg/dL (ref 83–193)
C4 COMPLEMENT: 32 mg/dL (ref 15–57)

## 2016-04-27 LAB — ANTI-NUCLEAR AB-TITER (ANA TITER): ANA Titer 1: 1:160 {titer} — ABNORMAL HIGH

## 2016-04-27 LAB — ANA: ANA: POSITIVE — AB

## 2016-04-27 NOTE — Progress Notes (Signed)
ANA 1:160 homogeneous, SSB positive. Patient is asymptomatic currently. She denies any sicca symptoms. I've advised her to come back in one year for a follow-up. If she develops any new symptoms she supposed to call me earlier. Please forward these labs to her PCP along with my note.

## 2016-05-06 ENCOUNTER — Encounter (HOSPITAL_COMMUNITY): Payer: Self-pay | Admitting: Emergency Medicine

## 2016-05-06 DIAGNOSIS — R109 Unspecified abdominal pain: Secondary | ICD-10-CM | POA: Diagnosis present

## 2016-05-06 DIAGNOSIS — D171 Benign lipomatous neoplasm of skin and subcutaneous tissue of trunk: Secondary | ICD-10-CM | POA: Diagnosis not present

## 2016-05-06 DIAGNOSIS — Z79899 Other long term (current) drug therapy: Secondary | ICD-10-CM | POA: Insufficient documentation

## 2016-05-06 DIAGNOSIS — Z87891 Personal history of nicotine dependence: Secondary | ICD-10-CM | POA: Diagnosis not present

## 2016-05-06 LAB — CBC
HCT: 38.3 % (ref 36.0–46.0)
Hemoglobin: 13.2 g/dL (ref 12.0–15.0)
MCH: 30.6 pg (ref 26.0–34.0)
MCHC: 34.5 g/dL (ref 30.0–36.0)
MCV: 88.9 fL (ref 78.0–100.0)
Platelets: 201 K/uL (ref 150–400)
RBC: 4.31 MIL/uL (ref 3.87–5.11)
RDW: 12.2 % (ref 11.5–15.5)
WBC: 7.6 K/uL (ref 4.0–10.5)

## 2016-05-06 LAB — LIPASE, BLOOD: LIPASE: 19 U/L (ref 11–51)

## 2016-05-06 LAB — COMPREHENSIVE METABOLIC PANEL WITH GFR
ALT: 27 U/L (ref 14–54)
AST: 23 U/L (ref 15–41)
Albumin: 4.4 g/dL (ref 3.5–5.0)
Alkaline Phosphatase: 51 U/L (ref 38–126)
Anion gap: 7 (ref 5–15)
BUN: 22 mg/dL — ABNORMAL HIGH (ref 6–20)
CO2: 26 mmol/L (ref 22–32)
Calcium: 9.4 mg/dL (ref 8.9–10.3)
Chloride: 106 mmol/L (ref 101–111)
Creatinine, Ser: 0.86 mg/dL (ref 0.44–1.00)
GFR calc Af Amer: >60 mL/min
GFR calc non Af Amer: >60 mL/min
Glucose, Bld: 101 mg/dL — ABNORMAL HIGH (ref 65–99)
Potassium: 4 mmol/L (ref 3.5–5.1)
Sodium: 139 mmol/L (ref 135–145)
Total Bilirubin: 0.4 mg/dL (ref 0.3–1.2)
Total Protein: 6.6 g/dL (ref 6.5–8.1)

## 2016-05-06 LAB — URINALYSIS, ROUTINE W REFLEX MICROSCOPIC
BACTERIA UA: NONE SEEN
Bilirubin Urine: NEGATIVE
GLUCOSE, UA: NEGATIVE mg/dL
Hgb urine dipstick: NEGATIVE
KETONES UR: NEGATIVE mg/dL
Nitrite: NEGATIVE
PROTEIN: NEGATIVE mg/dL
Specific Gravity, Urine: 1.019 (ref 1.005–1.030)
pH: 5 (ref 5.0–8.0)

## 2016-05-06 NOTE — ED Triage Notes (Signed)
Patient with fatty lipoma on lower ribcage.  Patient states that she has an appointment with surgery on 4/5 but the pain has gotten worse.  She states that the lipoma has gotten bigger in the last few days.  She does have some nausea.  No vomiting at this time.

## 2016-05-07 ENCOUNTER — Emergency Department (HOSPITAL_COMMUNITY): Payer: 59

## 2016-05-07 ENCOUNTER — Emergency Department (HOSPITAL_COMMUNITY)
Admission: EM | Admit: 2016-05-07 | Discharge: 2016-05-07 | Disposition: A | Discharge status: Home / Self Care | Payer: 59 | Attending: Emergency Medicine | Admitting: Emergency Medicine

## 2016-05-07 DIAGNOSIS — D171 Benign lipomatous neoplasm of skin and subcutaneous tissue of trunk: Secondary | ICD-10-CM

## 2016-05-07 MED ORDER — ONDANSETRON 4 MG PO TBDP
8.0000 mg | ORAL_TABLET | Freq: Once | ORAL | Status: AC
  Administered 2016-05-07: 8 mg via ORAL
  Filled 2016-05-07: qty 2

## 2016-05-07 MED ORDER — HYDROCODONE-ACETAMINOPHEN 5-325 MG PO TABS
1.0000 | ORAL_TABLET | Freq: Once | ORAL | Status: AC
  Administered 2016-05-07: 1 via ORAL
  Filled 2016-05-07: qty 1

## 2016-05-07 MED ORDER — IOPAMIDOL (ISOVUE-300) INJECTION 61%
INTRAVENOUS | Status: AC
  Filled 2016-05-07: qty 30

## 2016-05-07 MED ORDER — HYDROCODONE-ACETAMINOPHEN 5-325 MG PO TABS
1.0000 | ORAL_TABLET | Freq: Once | ORAL | Status: AC
Start: 2016-05-07 — End: 2016-05-07
  Administered 2016-05-07: 1 via ORAL
  Filled 2016-05-07: qty 1

## 2016-05-07 NOTE — ED Provider Notes (Signed)
[facility] DEPT Provider Note   CSN: [id number] Arrival date & time: 05/06/16  2215   By signing my name below, I, Delton Prairie, attest that this documentation has been prepared under the direction and in the presence of Ripley Fraise, MD  Electronically Signed: Delton Prairie, ED Scribe. 05/07/16. 2:06 AM.   History   Chief Complaint Chief Complaint  Patient presents with  . Abdominal Pain    HPI Comments:  Diana Bryant is a 51 y.o. female, with a PMHx of lipoma and PSHx of appendectomy, who presents to the Emergency Department complaining of acute onset, gradually worsening, shooting, left sided abdominal pain starting back on 01/2016 which worsened 2 days ago. Her pain is worse when laying on her left side. Pt states she was diagnosed with a fatty lipoma in 01/2016 and notes it is growing bigger. She also reports a pressure sensation to her lungs secondary to the pain, nausea and new left lower quadrant abdominal pain. Pt states she has an appointment for surgery on 05/13/2016 with Dr. Barkley Bruns. She has been taking Tylenol and ibuprofen with no relief. Pt denies fevers, cough, SOB, chest pain, diarrhea, vomiting, dysuria or any other associated symptoms. No other complaints noted.    The history is provided by the patient. No language interpreter was used.  Abdominal Pain   This is a recurrent problem. The current episode started more than 1 week ago. The problem occurs constantly. The problem has been gradually worsening. The pain is associated with an unknown factor. The pain is located in the LLQ and LUQ. The pain is moderate. Associated symptoms include nausea. Pertinent negatives include fever, diarrhea, vomiting and dysuria. The symptoms are aggravated by certain positions and palpation. Nothing relieves the symptoms.    Past Medical History:  Diagnosis Date  . Abnormal EKG    Long QT interval, corrected value of 480 ms  . Lactose intolerance   . Non-celiac gluten  sensitivity   . Palpitations    05/2011-normal CBC, normal BMet, normal EKG except borderline QT prolongation and  . Seasonal allergies     Patient Active Problem List   Diagnosis Date Noted  . Positive ANA (antinuclear antibody) 04/21/2016  . 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 Dyslipidemia 04/21/2016  . History of kidney stones 04/21/2016  . 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 Chronic ethmoidal sinusitis 04/21/2016  . Vitamin D deficiency 04/21/2016  . Hemorrhoids 12/02/2015  . Laboratory test 05/27/2011  . Palpitations   . Abnormal EKG     Past Surgical History:  Procedure Laterality Date  . ABDOMINAL HYSTERECTOMY    . LAPAROSCOPIC APPENDECTOMY      OB History    No data available       Home Medications    Prior to Admission medications   Medication Sig Start Date End Date Taking? Authorizing Provider  albuterol (PROVENTIL HFA;VENTOLIN HFA) 108 (90 BASE) MCG/ACT inhaler Inhale 1-2 puffs into the lungs every 6 (six) hours as needed for wheezing or shortness of breath. 10/26/13   Nat Christen, MD  Ascorbic Acid (VITAMIN C PO) Take 1 tablet by mouth daily.    Historical Provider, MD  Ascorbic Acid (VITAMIN C) 100 MG tablet Take 100 mg by mouth daily.    Historical Provider, MD  fluticasone (FLONASE) 50 MCG/ACT nasal spray Place 1 spray into both nostrils 2 (two) times daily.    Historical Provider, MD  ibuprofen (ADVIL,MOTRIN) 200 MG tablet Take 600 mg by mouth  daily as needed for headache.    Historical Provider, MD  loratadine (CLARITIN) 10 MG tablet Take 10 mg by mouth daily.    Historical Provider, MD  magnesium citrate SOLN Take 1 Bottle by mouth once.    Historical Provider, MD  Pregnenolone Micronized POWD by Does not apply route.    Historical Provider, MD  Probiotic Product (PROBIOTIC-10 PO) Take by mouth.    Historical Provider, MD  Red Yeast Rice Extract (RED YEAST RICE PO) Take by mouth.    Historical Provider, MD  UNABLE TO  FIND Med Name: beta yam    Historical Provider, MD  UNABLE TO FIND Med Name: stress care herbs    Historical Provider, MD  UNABLE TO FIND Med Name: beta yam cream    Historical Provider, MD    Family History Family History  Problem Relation Age of Onset  . Colon cancer Neg Hx     Social History Social History  Substance Use Topics  . Smoking status: Former Smoker    Start date: 12/01/2001    Quit date: 12/01/2005  . Smokeless tobacco: Never Used  . Alcohol use Yes     Comment: Rarely     Allergies   Amoxicillin; Levaquin [levofloxacin in d5w]; Sulfonamide derivatives; Contrast media [iodinated diagnostic agents]; and Oxycodone-acetaminophen   Review of Systems Review of Systems  Constitutional: Negative for fever.  Respiratory: Negative for cough and shortness of breath.   Cardiovascular: Negative for chest pain.  Gastrointestinal: Positive for abdominal pain and nausea. Negative for diarrhea and vomiting.  Genitourinary: Negative for dysuria.  All other systems reviewed and are negative.    Physical Exam Updated Vital Signs BP 124/75 (BP Location: Right Arm)   Pulse 67   Temp 98 F (36.7 C) (Oral)   Resp 19   SpO2 99%   Physical Exam CONSTITUTIONAL: Well developed/well nourished HEAD: Normocephalic/atraumatic EYES: EOMI/PERRL ENMT: Mucous membranes moist NECK: supple no meningeal signs SPINE/BACK:entire spine nontender CV: S1/S2 noted, no murmurs/rubs/gallops noted LUNGS: Lungs are clear to auscultation bilaterally, no apparent distress ABDOMEN: soft, moderate tenderness to LLQ, no rebound or guarding, bowel sounds noted throughout abdomen.  Lipoma noted to LUQ GU:no cva tenderness NEURO: Pt is awake/alert/appropriate, moves all extremitiesx4.  No facial droop.   EXTREMITIES: pulses normal/equal, full ROM SKIN: warm, color normal. Fatty lipoma noted to LUQ. No erythema noted but localized tenderness noted. No crepitus.  No induration PSYCH: no  abnormalities of mood noted, alert and oriented to situation  ED Treatments / Results  DIAGNOSTIC STUDIES:  Oxygen Saturation is 99% on RA, normal by my interpretation.    COORDINATION OF CARE:  2:04 AM Discussed treatment plan with pt at bedside and pt agreed to plan.  Labs (all labs ordered are listed, but only abnormal results are displayed) Labs Reviewed  COMPREHENSIVE METABOLIC PANEL - Abnormal; Notable for the following:       Result Value   Glucose, Bld 101 (*)    BUN 22 (*)    All other components within normal limits  URINALYSIS, ROUTINE W REFLEX MICROSCOPIC - Abnormal; Notable for the following:    Leukocytes, UA TRACE (*)    Squamous Epithelial / LPF 0-5 (*)    All other components within normal limits  LIPASE, BLOOD  CBC    EKG  EKG Interpretation None       Radiology Ct Abdomen Pelvis Wo Contrast  Result Date: 05/07/2016 CLINICAL DATA:  Left lower quadrant abdominal pain. EXAM: CT ABDOMEN AND PELVIS  WITHOUT CONTRAST TECHNIQUE: Multidetector CT imaging of the abdomen and pelvis was performed following the standard protocol without IV contrast. COMPARISON:  01/08/2015 CT of the abdomen and pelvis. FINDINGS: Lower chest: No acute abnormality. Hepatobiliary: Hepatic steatosis. No focal liver abnormality is seen. No gallstones, gallbladder wall thickening, or biliary dilatation. Pancreas: Unremarkable. No pancreatic ductal dilatation or surrounding inflammatory changes. Spleen: Normal in size without focal abnormality. Adrenals/Urinary Tract: Adrenal glands are unremarkable. Kidneys are normal, without renal calculi, focal lesion, or hydronephrosis. Bladder is unremarkable. Stomach/Bowel: Stomach is within normal limits. Appendix resected. No evidence of bowel wall thickening, distention, or inflammatory changes. Vascular/Lymphatic: No significant vascular findings are present. No enlarged abdominal or pelvic lymph nodes. Reproductive: Status post hysterectomy. No adnexal  masses. Other: No abdominal wall hernia or abnormality. No abdominopelvic ascites. Musculoskeletal: No fracture is seen. Mild lumbar spondylosis with disc space narrowing predominantly at the L2-3 level. IMPRESSION: 1. No acute process identified as explanation for abdominal pain. 2. Hepatic steatosis. Electronically Signed   By: Kristine Garbe M.D.   On: 05/07/2016 04:53    Procedures Procedures   Medications Ordered in ED Medications  iopamidol (ISOVUE-300) 61 % injection (not administered)  ondansetron (ZOFRAN-ODT) disintegrating tablet 8 mg (8 mg Oral Given 05/07/16 0249)  HYDROcodone-acetaminophen (NORCO/VICODIN) 5-325 MG per tablet 1 tablet (1 tablet Oral Given 05/07/16 0249)  HYDROcodone-acetaminophen (NORCO/VICODIN) 5-325 MG per tablet 1 tablet (1 tablet Oral Given 05/07/16 0605)     Initial Impression / Assessment and Plan / ED Course  I have reviewed the triage vital signs and the nursing notes.  Pertinent labs  results that were available during my care of the patient were reviewed by me and considered in my medical decision making (see chart for details).     Pt had focal LLQ tenderness in addition to her lipoma pain, therefore CT imaging ordered CT scan negative She is nontoxic She can f/u as outpatient with general surgery, continue OTC meds and can use heat therapy   Final Clinical Impressions(s) / ED Diagnoses   Final diagnoses:  Lipoma of torso    New Prescriptions New Prescriptions   No medications on file  I personally performed the services described in this documentation, which was scribed in my presence. The recorded information has been reviewed and is accurate.        Ripley Fraise, MD 05/07/16 [phone]

## 2016-06-04 ENCOUNTER — Ambulatory Visit: Payer: Self-pay | Admitting: Rheumatology

## 2016-06-07 ENCOUNTER — Telehealth (INDEPENDENT_AMBULATORY_CARE_PROVIDER_SITE_OTHER): Payer: Self-pay | Admitting: Rheumatology

## 2016-06-07 NOTE — Telephone Encounter (Signed)
[facility] called, needing pts last ov note and labs.. I faxed 04/26/2016 note & labs to [phone]. Callback [phone] ext [phone]

## 2019-07-10 DEATH — deceased

## 2020-12-31 ENCOUNTER — Other Ambulatory Visit (HOSPITAL_COMMUNITY): Payer: Self-pay | Admitting: Urology

## 2020-12-31 DIAGNOSIS — R1084 Generalized abdominal pain: Secondary | ICD-10-CM

## 2021-01-13 ENCOUNTER — Ambulatory Visit (HOSPITAL_COMMUNITY)
Admission: RE | Admit: 2021-01-13 | Discharge: 2021-01-13 | Disposition: A | Discharge status: Home / Self Care | Payer: Self-pay | Source: Ambulatory Visit | Attending: Urology | Admitting: Urology

## 2021-01-13 ENCOUNTER — Other Ambulatory Visit: Payer: Self-pay

## 2021-01-13 DIAGNOSIS — R1084 Generalized abdominal pain: Secondary | ICD-10-CM | POA: Insufficient documentation

## 2021-03-06 ENCOUNTER — Encounter: Payer: Self-pay | Admitting: Family Medicine

## 2021-03-07 ENCOUNTER — Emergency Department (HOSPITAL_COMMUNITY)
Admission: EM | Admit: 2021-03-07 | Discharge: 2021-03-07 | Disposition: A | Discharge status: Home / Self Care | Payer: 59 | Attending: Emergency Medicine | Admitting: Emergency Medicine

## 2021-03-07 ENCOUNTER — Encounter (HOSPITAL_COMMUNITY): Payer: Self-pay | Admitting: Emergency Medicine

## 2021-03-07 ENCOUNTER — Emergency Department (HOSPITAL_COMMUNITY): Payer: 59

## 2021-03-07 DIAGNOSIS — R1031 Right lower quadrant pain: Secondary | ICD-10-CM | POA: Diagnosis present

## 2021-03-07 DIAGNOSIS — M79604 Pain in right leg: Secondary | ICD-10-CM | POA: Diagnosis not present

## 2021-03-07 DIAGNOSIS — K760 Fatty (change of) liver, not elsewhere classified: Secondary | ICD-10-CM | POA: Diagnosis not present

## 2021-03-07 LAB — CBC WITH DIFFERENTIAL/PLATELET
Abs Immature Granulocytes: 0 10*3/uL (ref 0.00–0.07)
Basophils Absolute: 0 10*3/uL (ref 0.0–0.1)
Basophils Relative: 0 %
Eosinophils Absolute: 0.1 10*3/uL (ref 0.0–0.5)
Eosinophils Relative: 1 %
HCT: 40.1 % (ref 36.0–46.0)
Hemoglobin: 13.7 g/dL (ref 12.0–15.0)
Immature Granulocytes: 0 %
Lymphocytes Relative: 47 %
Lymphs Abs: 3.2 10*3/uL (ref 0.7–4.0)
MCH: 31.4 pg (ref 26.0–34.0)
MCHC: 34.2 g/dL (ref 30.0–36.0)
MCV: 92 fL (ref 80.0–100.0)
Monocytes Absolute: 0.4 10*3/uL (ref 0.1–1.0)
Monocytes Relative: 6 %
Neutro Abs: 3.2 10*3/uL (ref 1.7–7.7)
Neutrophils Relative %: 46 %
Platelets: 229 10*3/uL (ref 150–400)
RBC: 4.36 MIL/uL (ref 3.87–5.11)
RDW: 12.4 % (ref 11.5–15.5)
WBC: 6.9 10*3/uL (ref 4.0–10.5)
nRBC: 0 % (ref 0.0–0.2)

## 2021-03-07 LAB — BASIC METABOLIC PANEL
Anion gap: 6 (ref 5–15)
BUN: 23 mg/dL — ABNORMAL HIGH (ref 6–20)
CO2: 25 mmol/L (ref 22–32)
Calcium: 9.3 mg/dL (ref 8.9–10.3)
Chloride: 107 mmol/L (ref 98–111)
Creatinine, Ser: 0.64 mg/dL (ref 0.44–1.00)
GFR, Estimated: >60 mL/min (ref >60)
Glucose, Bld: 90 mg/dL (ref 70–99)
Potassium: 3.7 mmol/L (ref 3.5–5.1)
Sodium: 138 mmol/L (ref 135–145)

## 2021-03-07 LAB — URINALYSIS, ROUTINE W REFLEX MICROSCOPIC
Bilirubin Urine: NEGATIVE
Glucose, UA: NEGATIVE mg/dL
Hgb urine dipstick: NEGATIVE
Ketones, ur: NEGATIVE mg/dL
Leukocytes,Ua: NEGATIVE
Nitrite: NEGATIVE
Protein, ur: NEGATIVE mg/dL
Specific Gravity, Urine: 1.015 (ref 1.005–1.030)
pH: 6 (ref 5.0–8.0)

## 2021-03-07 MED ORDER — METHYLPREDNISOLONE 4 MG PO TBPK: ORAL_TABLET | ORAL | 0 refills | Status: AC

## 2021-03-07 MED ORDER — KETOROLAC TROMETHAMINE 30 MG/ML IJ SOLN
30.0000 mg | Freq: Once | INTRAMUSCULAR | Status: AC
Start: 2021-03-07 — End: 2021-03-07
  Administered 2021-03-07: 30 mg via INTRAMUSCULAR
  Filled 2021-03-07: qty 1

## 2021-03-07 NOTE — ED Notes (Signed)
Pt d/c home with spouse per MD order. Discharge summary reviewed with pt, pt verbalizes understanding. Ambulatory off unit. No s/s of acute distress noted at discharge.  

## 2021-03-07 NOTE — ED Triage Notes (Addendum)
Saw urologist in October for back/groin pain to r/o kidney stones . Groin pain has gotten worse. Pt now c/o right leg pain from groin to calf. Pt also c/o dizziness

## 2021-03-07 NOTE — ED Provider Notes (Signed)
[facility] EMERGENCY DEPARTMENT Provider Note   CSN: [id number] Arrival date & time: 03/07/21  1610     History No chief complaint on file.   Diana Bryant is a 56 y.o. female.  No known PMH.  Patient states that she started having right groin pain about 3 months ago.  At the time, she thought maybe she had a kidney stone, and she had a CT image to rule this out.  It was negative for kidney stones or other acute abnormalities.  Over the past 3 months, the pain pain has significantly worsened.  She thinks she feels a knot in right inguinal area at times.  It hurts worse with movement.  She at times, has radiating pain down her right leg.  She denies any back pain.  She has a history of appendectomy, hysterectomy with both ovaries removed.  The surgeries were years ago.  She has a PCP appointment on Monday to establish care somewhere.  HPI     Home Medications Prior to Admission medications   Medication Sig Start Date End Date Taking? Authorizing Provider  methylPREDNISolone (MEDROL DOSEPAK) 4 MG TBPK tablet Take 6 tablets (24 mg total) by mouth daily for 1 day, THEN 5 tablets (20 mg total) daily for 1 day, THEN 4 tablets (16 mg total) daily for 1 day, THEN 3 tablets (12 mg total) daily for 1 day, THEN 2 tablets (8 mg total) daily for 1 day, THEN 1 tablet (4 mg total) daily for 1 day. 03/07/21 03/13/21 Yes Tyrihanna Wingert, Adora Fridge, PA-C  albuterol (PROVENTIL HFA;VENTOLIN HFA) 108 (90 BASE) MCG/ACT inhaler Inhale 1-2 puffs into the lungs every 6 (six) hours as needed for wheezing or shortness of breath. 10/26/13   Nat Christen, MD  Ascorbic Acid (VITAMIN C PO) Take 1 tablet by mouth daily.    [provider]  fluticasone (FLONASE) 50 MCG/ACT nasal spray Place 1 spray into both nostrils 2 (two) times daily as needed for allergies.     [provider]  ibuprofen (ADVIL,MOTRIN) 200 MG tablet Take 600 mg by mouth daily as needed for headache.    [provider]  loratadine  (CLARITIN) 10 MG tablet Take 10 mg by mouth daily.    [provider]  Probiotic Product (PROBIOTIC-10 PO) Take 1 tablet by mouth 2 (two) times daily.     [provider]  Red Yeast Rice Extract (RED YEAST RICE PO) Take 1 tablet by mouth 2 (two) times daily.     [provider]  UNABLE TO FIND Apply 1 application topically 2 (two) times daily. Med Name: beta yam     [provider]  UNABLE TO FIND Take 1 tablet by mouth 2 (two) times daily. Med Name: stress care herbs     [provider]      Allergies    Amoxicillin, Levaquin [levofloxacin in d5w], Sulfonamide derivatives, Codeine, Contrast media [iodinated contrast media], and Oxycodone-acetaminophen    Review of Systems   Review of Systems  Genitourinary:  Positive for pelvic pain.  All other systems reviewed and are negative.  Physical Exam Updated Vital Signs BP 126/85    Pulse 62    Temp 97.8 F (36.6 C) (Oral)    Resp 16    SpO2 99%  Physical Exam Vitals and nursing note reviewed.  Constitutional:      General: She is not in acute distress.    Appearance: Normal appearance. She is not ill-appearing, toxic-appearing or diaphoretic.  HENT:  Head: Normocephalic and atraumatic.     Nose: No nasal deformity.     Mouth/Throat:     Lips: Pink. No lesions.     Mouth: Mucous membranes are moist. No injury, lacerations, oral lesions or angioedema.     Pharynx: Oropharynx is clear. Uvula midline. No pharyngeal swelling, oropharyngeal exudate, posterior oropharyngeal erythema or uvula swelling.  Eyes:     General: Gaze aligned appropriately. No scleral icterus.       Right eye: No discharge.        Left eye: No discharge.     Conjunctiva/sclera: Conjunctivae normal.     Right eye: Right conjunctiva is not injected. No exudate or hemorrhage.    Left eye: Left conjunctiva is not injected. No exudate or hemorrhage.    Pupils: Pupils are equal, round, and reactive to light.   Cardiovascular:     Rate and Rhythm: Normal rate and regular rhythm.     Pulses: Normal pulses.          Radial pulses are 2+ on the right side and 2+ on the left side.       Dorsalis pedis pulses are 2+ on the right side and 2+ on the left side.     Heart sounds: Normal heart sounds, S1 normal and S2 normal. Heart sounds not distant. No murmur heard.   No friction rub. No gallop. No S3 or S4 sounds.  Pulmonary:     Effort: Pulmonary effort is normal. No accessory muscle usage or respiratory distress.     Breath sounds: Normal breath sounds. No stridor. No wheezing, rhonchi or rales.  Chest:     Chest wall: No tenderness.  Abdominal:     General: Abdomen is flat. Bowel sounds are normal. There is no distension.     Palpations: Abdomen is soft. There is no mass or pulsatile mass.     Tenderness: There is no abdominal tenderness. There is no guarding or rebound.     Hernia: No hernia is present.       Comments: There is tenderness in the right inguinal area.  I do feel a small knot.  Not able to reduce this.  Not is not pulsatile.  Patient has good femoral pulse.  She has good pedal pulses and sensation.  Musculoskeletal:     Right lower leg: No edema.     Left lower leg: No edema.     Comments: No lumbar midline ttp or stepoffs. No lateral hip or pelvis ttp. Straight leg test negative for sciatica. Patient has normal sensation in lower ext. She has normal strength bilaterally and normal pulses.   Skin:    General: Skin is warm and dry.     Coloration: Skin is not jaundiced or pale.     Findings: No bruising, erythema, lesion or rash.  Neurological:     General: No focal deficit present.     Mental Status: She is alert and oriented to person, place, and time.     GCS: GCS eye subscore is 4. GCS verbal subscore is 5. GCS motor subscore is 6.  Psychiatric:        Mood and Affect: Mood normal.        Behavior: Behavior normal. Behavior is cooperative.    ED Results / Procedures /  Treatments   Labs (all labs ordered are listed, but only abnormal results are displayed) Labs Reviewed  BASIC METABOLIC PANEL - Abnormal; Notable for the following components:  Result Value   BUN 23 (*)    All other components within normal limits  CBC WITH DIFFERENTIAL/PLATELET  URINALYSIS, ROUTINE W REFLEX MICROSCOPIC    EKG None  Radiology CT Abdomen Pelvis Wo Contrast  Result Date: 03/07/2021 CLINICAL DATA:  Right lower quadrant abdominal pain. EXAM: CT ABDOMEN AND PELVIS WITHOUT CONTRAST TECHNIQUE: Multidetector CT imaging of the abdomen and pelvis was performed following the standard protocol without IV contrast. RADIATION DOSE REDUCTION: This exam was performed according to the departmental dose-optimization program which includes automated exposure control, adjustment of the mA and/or kV according to patient size and/or use of iterative reconstruction technique. COMPARISON:  CT abdomen and pelvis 01/13/2021. FINDINGS: Lower chest: There is minimal atelectasis in the right lung base. Hepatobiliary: Again seen is diffuse fatty infiltration of the liver. No gallstones are identified. There is no biliary ductal dilatation. Pancreas: Unremarkable. No pancreatic ductal dilatation or surrounding inflammatory changes. Spleen: Normal in size without focal abnormality. Adrenals/Urinary Tract: Adrenal glands are unremarkable. Kidneys are normal, without renal calculi, focal lesion, or hydronephrosis. Bladder is unremarkable. Stomach/Bowel: Stomach is within normal limits. Appendix is surgically absent. No evidence of bowel wall thickening, distention, or inflammatory changes. Vascular/Lymphatic: Aortic atherosclerosis. No enlarged abdominal or pelvic lymph nodes. Reproductive: Status post hysterectomy. No adnexal masses. Other: No abdominal wall hernia or abnormality. No abdominopelvic ascites. Musculoskeletal: No acute or significant osseous findings. IMPRESSION: 1. No acute localizing process in  the abdomen or pelvis. 2. Unchanged fatty infiltration of the liver. 3.  Aortic Atherosclerosis (ICD10-I70.0). Electronically Signed   By: Ronney Asters M.D.   On: 03/07/2021 20:55    Procedures Procedures   Medications Ordered in ED Medications  ketorolac (TORADOL) 30 MG/ML injection 30 mg (30 mg Intramuscular Given 03/07/21 2115)    ED Course/ Medical Decision Making/ A&P Clinical Course as of 03/07/21 2358  Sat Mar 07, 2021  2005 Small round mass is nonpulsatile.  This could represent a hernia.  We will get CT scan imaging.  Patient has contrast allergy so will obtain without contrast.  Will Also obtain UA and labs. [GL]    Clinical Course User Index [GL] Adolphus Birchwood, PA-C                           Medical Decision Making Problems Addressed: Right inguinal pain: chronic illness or injury with exacerbation, progression, or side effects of treatment  Amount and/or Complexity of Data Reviewed Independent Historian:     Details: Independent historian Labs: ordered. Decision-making details documented in ED Course. Radiology: ordered and independent interpretation performed. Decision-making details documented in ED Course.  Risk Prescription drug management.   Patient presents with worsening chronic right inguinal pain.  Apparently has gotten to the point where she is unable to function in her daily life.  On assessment of the area, there is a nonpulsatile knot present in the right inguinal area.  This could be a hernia versus hematoma versus pseudoaneurysm.  Patient symptoms are also consistent with musculoskeletal injury.  CT scan to evaluate this area.  Patient has a contrast allergy, however feel we can see what we need to see without contrast. CT did not return with any evidence of hernia, pseudoaneurysm, or other acute abnormality. He has had multiple abdominal surgeries in the area including an appendectomy, total hysterectomy with fallopian tube and ovarian removal.  It  is possible that her pain has been exacerbated from scar tissue.  I doubt that it  is a hernia because she has now had 2 negative CT scans that did not reveal this.  I also do not suspect that this is an arterial or venous issue as patient has normal pulses, CT did not show any abnormalities, area is nonpulsatile.  I think it is most likely this is a musculoskeletal injury.  She could have a tear or other injury that is exacerbating her symptoms.  It does seem like she does have a sciatica element at times.  I will give patient a Medrol Dosepak.  She also received orthopedic follow-up.  She also needs to follow-up with her PCP on Monday.   Final Clinical Impression(s) / ED Diagnoses Final diagnoses:  Right inguinal pain    Rx / DC Orders ED Discharge Orders          Ordered    methylPREDNISolone (MEDROL DOSEPAK) 4 MG TBPK tablet        03/07/21 2201              Adolphus Birchwood, PA-C 03/07/21 2358    Fredia Sorrow, MD 03/08/21 [phone]

## 2021-03-07 NOTE — ED Notes (Signed)
Pt requesting pain medication. This RN notified ED PA Shirlee Limerick.

## 2021-03-07 NOTE — Discharge Instructions (Addendum)
Please follow up with PCP regarding symptoms on Monday. I have also provided you a orthopedic referral with Dr. Larena Glassman to evaluate whether this is a soft tissue injury you are experiencing. I have sent a prescription for steroid taper to your pharmacy. Please pick this up and take as prescribed.

## 2021-03-09 ENCOUNTER — Encounter: Payer: Self-pay | Admitting: Family Medicine

## 2021-03-09 ENCOUNTER — Other Ambulatory Visit: Payer: Self-pay

## 2021-03-09 ENCOUNTER — Ambulatory Visit: Payer: 59 | Admitting: Family Medicine

## 2021-03-09 VITALS — BP 148/90 | HR 71 | Temp 97.8°F | Ht 62.6 in | Wt 163.0 lb

## 2021-03-09 DIAGNOSIS — K760 Fatty (change of) liver, not elsewhere classified: Secondary | ICD-10-CM

## 2021-03-09 DIAGNOSIS — R1030 Lower abdominal pain, unspecified: Secondary | ICD-10-CM | POA: Diagnosis not present

## 2021-03-09 DIAGNOSIS — I700 Atherosclerosis of aorta: Secondary | ICD-10-CM | POA: Diagnosis not present

## 2021-03-09 DIAGNOSIS — Z7689 Persons encountering health services in other specified circumstances: Secondary | ICD-10-CM | POA: Diagnosis not present

## 2021-03-09 MED ORDER — DICLOFENAC SODIUM 75 MG PO TBEC: 75.0000 mg | DELAYED_RELEASE_TABLET | Freq: Two times a day (BID) | ORAL | 0 refills | Status: DC

## 2021-03-09 NOTE — Patient Instructions (Addendum)
°  Diclofenac every 12 hours with food.  Referral sports med placed.   Rest muscle group as much as possible.

## 2021-03-09 NOTE — Progress Notes (Signed)
Thought she has kidney issue because she was having pain in that area. No stones and has seen urology for it. Went to ED 03/07/21 due to pain in groin area. It was suggested to her to maybe seen orthopedic. Sill having pain in right groin area.

## 2021-03-09 NOTE — Progress Notes (Signed)
Patient ID: Diana Bryant, female  DOB: [birth year], 56 y.o.   MRN: [id number] Patient Care Team    Relationship Specialty Notifications Start End  Ma Hillock, DO PCP - General Family Medicine  03/09/21   Yehuda Savannah, MD (Inactive)  Cardiology  05/27/11   Daneil Dolin, MD Consulting Physician Gastroenterology  11/24/15   Lucas Mallow, MD Consulting Physician Urology  03/06/21     Chief Complaint  Patient presents with   Establish Care   Groin Pain    Started in October    Subjective: Diana Bryant is a 56 y.o.  female present for new patient establishment. All past medical history, surgical history, allergies, family history, immunizations, medications and social history were updated in the electronic medical record today. All recent labs, ED visits and hospitalizations within the last year were reviewed.  Inguinal pain: Patient presents for new patient establishment with complaints of right inguinal pain with onset being early October.  She does not recall a specific injury during this time.  But she does recall shoving a cinder block without leg around that time.  She has been evaluated by [facility], secondary to her thinking she had a kidney stone creating her discomfort.  She also was evaluated in the emergency room 2 days ago for same pain.  CT abdomen pelvis were obtained on both accounts, both were normal without any acute processes.  She states the pain is constant every day.  Days with increased activity, standing etc. will cause the pain to be more severe.  She normally walks 3 to 5 miles approximately 5 days a week and has been unable to do so secondary to discomfort.  She states when she sits or bends over it causes a pressure feeling in her right inguinal area.  She has been using Advil 800 mg to help with pain.  She is also attempted to ice the area.   Depression screen [facility] 2/9 03/09/2021 03/09/2021  Decreased Interest 0 0  Down, Depressed,  Hopeless 0 0  PHQ - 2 Score 0 0  Altered sleeping 1 -  Tired, decreased energy 0 -  Change in appetite 0 -  Feeling bad or failure about yourself  0 -  Trouble concentrating 0 -  Moving slowly or fidgety/restless 0 -  Suicidal thoughts 0 -  PHQ-9 Score 1 -   GAD 7 : Generalized Anxiety Score 03/09/2021  Nervous, Anxious, on Edge 0  Control/stop worrying 0  Worry too much - different things 0  Trouble relaxing 0  Restless 0  Easily annoyed or irritable 0  Afraid - awful might happen 0  Total GAD 7 Score 0        No flowsheet data found.  There is no immunization history on file for this patient.  No results found.  Past Medical History:  Diagnosis Date   Abnormal EKG    Long QT interval, corrected value of 480 ms   Allergies    Asthma    Chicken pox    Hemorrhoids 12/02/2015   History of kidney stones 04/21/2016   Dr. Phillis Knack -- Uro   Hyperlipidemia    Lactose intolerance    Non-celiac gluten sensitivity    Palpitations    05/2011-normal CBC, normal BMet, normal EKG except borderline QT prolongation and   Seasonal allergies    UTI (urinary tract infection)    Allergies  Allergen Reactions   Amoxicillin Anaphylaxis  Has patient had a PCN reaction causing immediate rash, facial/tongue/throat swelling, SOB or lightheadedness with hypotension: {yes Has patient had a PCN reaction causing severe rash involving mucus membranes or skin necrosis: {yes Has patient had a PCN reaction that required hospitalization {yes Has patient had a PCN reaction occurring within the last 10 years: yes If all of the above answers are "NO", then may proceed with Cephalosporin use.   Levaquin [Levofloxacin In D5w] Anaphylaxis   Sulfonamide Derivatives Anaphylaxis and Hives   Codeine    Contrast Media [Iodinated Contrast Media]    Oxycodone-Acetaminophen Hives   Past Surgical History:  Procedure Laterality Date   ABDOMINAL HYSTERECTOMY  1997   LAPAROSCOPIC APPENDECTOMY  02/1997    LIPOMA EXCISION  2018   Family History  Problem Relation Age of Onset   Dementia Mother        vascular   Prostate cancer Maternal Grandfather    Alcohol abuse Half-Brother    Drug abuse Half-Brother    Social History   Social History Narrative   Marital status/children/pets: Married. G4P3- 4 daughters   Education/employment: some college. Retired-sales.    Safety:      -smoke alarm in the home:Yes     - wears seatbelt: Yes     - Feels safe in their relationships: Yes       Allergies as of 03/09/2021       Reactions   Amoxicillin Anaphylaxis   Has patient had a PCN reaction causing immediate rash, facial/tongue/throat swelling, SOB or lightheadedness with hypotension: {yes Has patient had a PCN reaction causing severe rash involving mucus membranes or skin necrosis: {yes Has patient had a PCN reaction that required hospitalization {yes Has patient had a PCN reaction occurring within the last 10 years: yes If all of the above answers are "NO", then may proceed with Cephalosporin use.   Levaquin [levofloxacin In D5w] Anaphylaxis   Sulfonamide Derivatives Anaphylaxis, Hives   Codeine    Contrast Media [iodinated Contrast Media]    Oxycodone-acetaminophen Hives        Medication List        Accurate as of March 09, 2021 12:36 PM. If you have any questions, ask your nurse or doctor.          STOP taking these medications    ibuprofen 200 MG tablet Commonly known as: ADVIL Stopped by: Howard Pouch, DO   loratadine 10 MG tablet Commonly known as: CLARITIN Stopped by: Howard Pouch, DO   RED YEAST RICE PO Stopped by: Howard Pouch, DO       TAKE these medications    albuterol 108 (90 Base) MCG/ACT inhaler Commonly known as: VENTOLIN HFA Inhale 1-2 puffs into the lungs every 6 (six) hours as needed for wheezing or shortness of breath.   B-12 PO Take 1 tablet by mouth daily.   diclofenac 75 MG EC tablet Commonly known as: VOLTAREN Take 1 tablet (75 mg  total) by mouth 2 (two) times daily. Started by: Howard Pouch, DO   fexofenadine 180 MG tablet Commonly known as: ALLEGRA Take 180 mg by mouth as needed for allergies or rhinitis.   fluticasone 50 MCG/ACT nasal spray Commonly known as: FLONASE Place 1 spray into both nostrils 2 (two) times daily as needed for allergies.   methylPREDNISolone 4 MG Tbpk tablet Commonly known as: MEDROL DOSEPAK Take 6 tablets (24 mg total) by mouth daily for 1 day, THEN 5 tablets (20 mg total) daily for 1 day, THEN 4 tablets (16  mg total) daily for 1 day, THEN 3 tablets (12 mg total) daily for 1 day, THEN 2 tablets (8 mg total) daily for 1 day, THEN 1 tablet (4 mg total) daily for 1 day. Start taking on: March 07, 2021   OVER THE COUNTER MEDICATION   PROBIOTIC-10 PO Take 1 tablet by mouth 2 (two) times daily.   QC TUMERIC COMPLEX PO Take 1 tablet by mouth daily.   UNABLE TO FIND Apply 1 application topically 2 (two) times daily. Med Name: beta yam   UNABLE TO FIND Take 1 tablet by mouth 2 (two) times daily. Med Name: stress care herbs   VITAMIN C PO Take 1 tablet by mouth daily.   VITAMIN D PO Take 1 capsule by mouth daily.        All past medical history, surgical history, allergies, family history, immunizations andmedications were updated in the EMR today and reviewed under the history and medication portions of their EMR.    Recent Results (from the past 2160 hour(s))  Urinalysis, Routine w reflex microscopic Urine, Clean Catch     Status: None   Collection Time: 03/07/21  8:12 PM  Result Value Ref Range   Color, Urine YELLOW YELLOW   APPearance CLEAR CLEAR   Specific Gravity, Urine 1.015 1.005 - 1.030   pH 6.0 5.0 - 8.0   Glucose, UA NEGATIVE NEGATIVE mg/dL   Hgb urine dipstick NEGATIVE NEGATIVE   Bilirubin Urine NEGATIVE NEGATIVE   Ketones, ur NEGATIVE NEGATIVE mg/dL   Protein, ur NEGATIVE NEGATIVE mg/dL   Nitrite NEGATIVE NEGATIVE   Leukocytes,Ua NEGATIVE NEGATIVE     Comment: Microscopic not done on urines with negative protein, blood, leukocytes, nitrite, or glucose < 500 mg/dL. Performed at [facility], [address], [place], [place] [address]   Basic metabolic panel     Status: Abnormal   Collection Time: 03/07/21  8:29 PM  Result Value Ref Range   Sodium 138 135 - 145 mmol/L   Potassium 3.7 3.5 - 5.1 mmol/L   Chloride 107 98 - 111 mmol/L   CO2 25 22 - 32 mmol/L   Glucose, Bld 90 70 - 99 mg/dL    Comment: Glucose reference range applies only to samples taken after fasting for at least 8 hours.   BUN 23 (H) 6 - 20 mg/dL   Creatinine, Ser 0.64 0.44 - 1.00 mg/dL   Calcium 9.3 8.9 - 10.3 mg/dL   GFR, Estimated >60 >60 mL/min    Comment: (NOTE) Calculated using the CKD-EPI Creatinine Equation (2021)    Anion gap 6 5 - 15    Comment: Performed at [facility], [address], [place],  [address]  CBC with Differential     Status: None   Collection Time: 03/07/21  8:29 PM  Result Value Ref Range   WBC 6.9 4.0 - 10.5 K/uL   RBC 4.36 3.87 - 5.11 MIL/uL   Hemoglobin 13.7 12.0 - 15.0 g/dL   HCT 40.1 36.0 - 46.0 %   MCV 92.0 80.0 - 100.0 fL   MCH 31.4 26.0 - 34.0 pg   MCHC 34.2 30.0 - 36.0 g/dL   RDW 12.4 11.5 - 15.5 %   Platelets 229 150 - 400 K/uL   nRBC 0.0 0.0 - 0.2 %   Neutrophils Relative % 46 %   Neutro Abs 3.2 1.7 - 7.7 K/uL   Lymphocytes Relative 47 %   Lymphs Abs 3.2 0.7 - 4.0 K/uL   Monocytes Relative 6 %  Monocytes Absolute 0.4 0.1 - 1.0 K/uL   Eosinophils Relative 1 %   Eosinophils Absolute 0.1 0.0 - 0.5 K/uL   Basophils Relative 0 %   Basophils Absolute 0.0 0.0 - 0.1 K/uL   Immature Granulocytes 0 %   Abs Immature Granulocytes 0.00 0.00 - 0.07 K/uL    Comment: Performed at [facility], [address], [place], [place] [address]    CT Abdomen Pelvis Wo Contrast Result Date: 03/07/2021 IMPRESSION: 1. No acute localizing process in the abdomen or pelvis. 2. Unchanged fatty infiltration of the liver. 3.  Aortic  Atherosclerosis (ICD10-I70.0). Electronically Signed   By: Ronney Asters M.D.   On: 03/07/2021 20:55     ROS 14 pt review of systems performed and negative (unless mentioned in an HPI)  Objective:  BP (!) 148/90    Pulse 71    Temp 97.8 F (36.6 C)    Ht 5' 2.6" (1.59 m)    Wt 163 lb (73.9 kg)    SpO2 100%    BMI 29.25 kg/m   Physical Exam Vitals and nursing note reviewed.  Constitutional:      General: She is not in acute distress.    Appearance: Normal appearance. She is normal weight. She is not ill-appearing or toxic-appearing.  Eyes:     Extraocular Movements: Extraocular movements intact.     Conjunctiva/sclera: Conjunctivae normal.     Pupils: Pupils are equal, round, and reactive to light.  Cardiovascular:     Rate and Rhythm: Normal rate and regular rhythm.  Pulmonary:     Effort: Pulmonary effort is normal.     Breath sounds: Normal breath sounds.  Musculoskeletal:     Right upper leg: Tenderness present. No swelling, edema, deformity, lacerations or bony tenderness.     Left upper leg: Normal.     Comments: RLE: TTP hip flexor muscle group.  5/5 MS bilateral LE.  Discomfort with mild weakness on hip flexion.  SLR negative.FABRE positive for posterior SI pain.  Neurological:     Mental Status: She is alert and oriented to person, place, and time. Mental status is at baseline.  Psychiatric:        Mood and Affect: Mood normal.        Behavior: Behavior normal.        Thought Content: Thought content normal.        Judgment: Judgment normal.      Assessment/plan: TANESHIA LORENCE is a 56 y.o. female present for est care Encounter to establish care Inguinal pain, unspecified laterality Exam is consistent with MSK cause of discomfort.  Normal labs and CTx2 reassuring not abd causes.  Rest. ICE PRN Diclofenac BID Referral to SM- discussed ddx: femoral hernia, hip flexor, psoas etiologies.  - Ambulatory referral to Sports Medicine  Aortic atherosclerosis  [facility]) Noted on CT image.  Pt encouraged to schedule a physical to address preventive measures and fastig labs.   Hepatic steatosis Noted on CT. Pt will make CPE appt- labs collected at that time. Per EMR labs have been stable.     No follow-ups on file.  Orders Placed This Encounter  Procedures   Ambulatory referral to Sports Medicine   Meds ordered this encounter  Medications   diclofenac (VOLTAREN) 75 MG EC tablet    Sig: Take 1 tablet (75 mg total) by mouth 2 (two) times daily.    Dispense:  60 tablet    Refill:  0   Referral Orders  Ambulatory referral to Sports Medicine       Note is dictated utilizing voice recognition software. Although note has been proof read prior to signing, occasional typographical errors still can be missed. If any questions arise, please do not hesitate to call for verification.  Electronically signed by: Howard Pouch, DO [place]

## 2021-03-09 NOTE — Progress Notes (Signed)
Diana Bryant Diana[place] Bryant Diana Bryant Phone: [phone]   Assessment and Plan:     1. Right inguinal pain -Chronic with exacerbation, initial sports medicine visit - Significant right groin pain present for 3 months and worsening over the weekend - I suspect that patient's pain may be due to an intra-articular process based on HPI, physical exam, unremarkable CT/abdomen/pelvis - Ultrasound was performed over area of maximal tenderness with patient at rest as well as Valsalva, and no significant abnormalities were seen, no herniation seen - Patient elected to proceed with CSI intra-articular hip.  Tolerated well per note below - Avoid activities that include deep hip flexion for the next 2 weeks - Tylenol as needed for breakthrough pain - [place] LIMITED JOINT SPACE STRUCTURES LOW RIGHT(NO LINKED CHARGES)    Procedure: Ultrasound Guided Hip Acetabulofemoral Joint Injection Side: Right Diagnosis: Right groin pain [place] Indication:  - accuracy is paramount for diagnosis - to ensure therapeutic efficacy or procedural success - to reduce procedural risk  After explaining the procedure, viable alternatives, risks, and answering any questions, consent was given verbally. The site was cleaned with chlorhexidine prep. An ultrasound transducer was placed on the anterior thigh/hip.   The acetabular joint, labrum, and femoral shaft were identified.  The neurovascular structures were identified and an approach was found specifically avoiding these structures.  A steroid injection was performed under ultrasound guidance with sterile technique using 56ml of 1% lidocaine without epinephrine and 40 mg of triamcinolone (KENALOG) 40mg /ml. This was well tolerated and resulted in relief.  Needle was removed and dressing placed and post injection instructions were given including  a discussion of likely return of pain today after the anesthetic wears off  (with the possibility of worsened pain) until the steroid starts to work in 1-3 days.   Pt was advised to call or return to clinic if these symptoms worsen or fail to improve as anticipated.   Pertinent previous records reviewed include ER note 03/07/2021, PCP note 03/09/2021, CT abdomen/pelvis 03/07/2021   Follow Up: 2 weeks for reevaluation.  If no improvement or worsening of symptoms would consider hip MRI to evaluate for labral tear   Subjective:   I, Diana Bryant, am serving as a Education administrator for Diana Bryant  Chief Complaint: inguinal pain   HPI:  03/10/2021 Patient is a 56 year old female complaining of inguinal pain. Patient states right inguinal pain deep with onset being early October. she recalls shoving a cinder block without leg around that time. No specific MOI where she had a oh my goodness this hurts .  Days with increased activity, standing etc. will cause the pain to be more severe.  She normally walks 3 to 5 miles approximately 5 days a week and has been unable to do so secondary to discomfort for about a month .  She states when she sits or bends over it causes a pressure feeling in her right inguinal area. Hurts to have a bowel movement  She has been using Advil 800 mg to help with pain.  She is also attempted to ice the area. Last night was a rough night wasn't able to sleep or get comfortable no hernia before she started hurting really bad she had an episode where she was dizzy and wasn't able to stand had to crawl to the bathroom and then she walked back to bed and laid down then she was fine    Relevant Historical  Information: History of positive ANA  Additional pertinent review of systems negative.   Current Outpatient Medications:    albuterol (PROVENTIL HFA;VENTOLIN HFA) 108 (90 BASE) MCG/ACT inhaler, Inhale 1-2 puffs into the lungs every 6 (six) hours as needed for wheezing or shortness of breath., Disp: 1 Inhaler, Rfl: 0   Ascorbic Acid (VITAMIN C PO), Take 1  tablet by mouth daily., Disp: , Rfl:    Cyanocobalamin (B-12 PO), Take 1 tablet by mouth daily., Disp: , Rfl:    diclofenac (VOLTAREN) 75 MG EC tablet, Take 1 tablet (75 mg total) by mouth 2 (two) times daily., Disp: 60 tablet, Rfl: 0   fexofenadine (ALLEGRA) 180 MG tablet, Take 180 mg by mouth as needed for allergies or rhinitis., Disp: , Rfl:    fluticasone (FLONASE) 50 MCG/ACT nasal spray, Place 1 spray into both nostrils 2 (two) times daily as needed for allergies. , Disp: , Rfl:    methylPREDNISolone (MEDROL DOSEPAK) 4 MG TBPK tablet, Take 6 tablets (24 mg total) by mouth daily for 1 day, THEN 5 tablets (20 mg total) daily for 1 day, THEN 4 tablets (16 mg total) daily for 1 day, THEN 3 tablets (12 mg total) daily for 1 day, THEN 2 tablets (8 mg total) daily for 1 day, THEN 1 tablet (4 mg total) daily for 1 day., Disp: 21 tablet, Rfl: 0   OVER THE COUNTER MEDICATION, , Disp: , Rfl:    Probiotic Product (PROBIOTIC-10 PO), Take 1 tablet by mouth 2 (two) times daily. , Disp: , Rfl:    Turmeric (QC TUMERIC COMPLEX PO), Take 1 tablet by mouth daily., Disp: , Rfl:    UNABLE TO FIND, Apply 1 application topically 2 (two) times daily. Med Name: beta yam , Disp: , Rfl:    UNABLE TO FIND, Take 1 tablet by mouth 2 (two) times daily. Med Name: stress care herbs , Disp: , Rfl:    VITAMIN D PO, Take 1 capsule by mouth daily., Disp: , Rfl:    Objective:     Vitals:   03/10/21 0950  BP: 122/70  Pulse: 80  SpO2: 98%  Weight: 162 lb (73.5 kg)  Height: 5\' 2"  (1.575 m)      Body mass index is 29.63 kg/m.    Physical Exam:    General: awake, alert, and oriented no acute distress, nontoxic Skin: no suspicious lesions or rashes Neuro:sensation intact distally with no dificits, normal muscle tone, no atrophy, strength 5/5 in all tested lower ext groups Psych: normal mood and affect, speech clear  Right hip: No deformity, swelling or wasting ROM Fexion 90 (painful at end range) , ext 30, IR 45, ER  45 TTP hip flexors NTTP over the greater troch, glute musculature, si joint, lumbar spine Negative log roll with FROM Negative FABER Positive FADIR Negative Piriformis test Negative trendelenberg Gait normal    Electronically signed by:  Diana Bryant [facility] 11:22 AM 03/10/21

## 2021-03-10 ENCOUNTER — Ambulatory Visit: Payer: Self-pay

## 2021-03-10 ENCOUNTER — Ambulatory Visit (INDEPENDENT_AMBULATORY_CARE_PROVIDER_SITE_OTHER): Payer: 59 | Admitting: Sports Medicine

## 2021-03-10 ENCOUNTER — Other Ambulatory Visit: Payer: Self-pay

## 2021-03-10 VITALS — BP 122/70 | HR 80 | Ht 62.0 in | Wt 162.0 lb

## 2021-03-10 DIAGNOSIS — R1031 Right lower quadrant pain: Secondary | ICD-10-CM | POA: Diagnosis not present

## 2021-03-10 NOTE — Patient Instructions (Addendum)
Good to see you  Tylenol as needed for pain  2 week follow up

## 2021-03-17 NOTE — Progress Notes (Signed)
Benito Mccreedy D.[place] Phone: [phone]   Assessment and Plan:     1. Right hip pain -Chronic with significant improvement, subsequent visit -Significant improvement in exacerbation of chronic pain after intra-articular right hip CSI at previous office visit.  Due to patient's significant improvement with CSI, it is likely that primary pain generator was intra-articular for patient - Recommend restarting physical activity gradually, HEP for hip strengthening, Tylenol as needed for pain control   Pertinent previous records reviewed include none   Follow Up: - Instructed patient that she can follow-up as needed and we could consider additional CSI after 06/07/2021 allowing for 3 months between injections     Subjective:   I, [name], am serving as a Education administrator for Doctor Glennon Mac  Chief Complaint: right inguinal pain   HPI:  03/10/2021 Patient is a 56 year old female complaining of inguinal pain. Patient states right inguinal pain deep with onset being early October. she recalls shoving a cinder block without leg around that time. No specific MOI where she had a oh my goodness this hurts .  Days with increased activity, standing etc. will cause the pain to be more severe.  She normally walks 3 to 5 miles approximately 5 days a week and has been unable to do so secondary to discomfort for about a month .  She states when she sits or bends over it causes a pressure feeling in her right inguinal area. Hurts to have a bowel movement  She has been using Advil 800 mg to help with pain.  She is also attempted to ice the area. Last night was a rough night wasn't able to sleep or get comfortable no hernia before she started hurting really bad she had an episode where she was dizzy and wasn't able to stand had to crawl to the bathroom and then she walked back to bed and laid down then she was fine     03/24/2021 Patient states that she is doing much better than when she came in the first time . That deep pain in the front has decreased significantly, has been able to sleep. Is still having a little bit of pain wants to know how far to push it, wants to know what will be suggested to make sure this never happens again has noticed that the pain is in the hip not deep in the inguinal area    Relevant Historical Information: History of positive ANA  Additional pertinent review of systems negative.   Current Outpatient Medications:    albuterol (PROVENTIL HFA;VENTOLIN HFA) 108 (90 BASE) MCG/ACT inhaler, Inhale 1-2 puffs into the lungs every 6 (six) hours as needed for wheezing or shortness of breath., Disp: 1 Inhaler, Rfl: 0   Ascorbic Acid (VITAMIN C PO), Take 1 tablet by mouth daily., Disp: , Rfl:    Cyanocobalamin (B-12 PO), Take 1 tablet by mouth daily., Disp: , Rfl:    diclofenac (VOLTAREN) 75 MG EC tablet, Take 1 tablet (75 mg total) by mouth 2 (two) times daily., Disp: 60 tablet, Rfl: 0   fexofenadine (ALLEGRA) 180 MG tablet, Take 180 mg by mouth as needed for allergies or rhinitis., Disp: , Rfl:    fluticasone (FLONASE) 50 MCG/ACT nasal spray, Place 1 spray into both nostrils 2 (two) times daily as needed for allergies. , Disp: , Rfl:    OVER THE COUNTER MEDICATION, , Disp: , Rfl:    Probiotic  Product (PROBIOTIC-10 PO), Take 1 tablet by mouth 2 (two) times daily. , Disp: , Rfl:    Turmeric (QC TUMERIC COMPLEX PO), Take 1 tablet by mouth daily., Disp: , Rfl:    UNABLE TO FIND, Apply 1 application topically 2 (two) times daily. Med Name: beta yam , Disp: , Rfl:    UNABLE TO FIND, Take 1 tablet by mouth 2 (two) times daily. Med Name: stress care herbs , Disp: , Rfl:    VITAMIN D PO, Take 1 capsule by mouth daily., Disp: , Rfl:    Objective:     Vitals:   03/24/21 0929  BP: 128/70  Pulse: 88  SpO2: 99%  Weight: 164 lb (74.4 kg)  Height: 5\' 2"  (1.575 m)      Body mass index is 30  kg/m.    Physical Exam:    General: awake, alert, and oriented no acute distress, nontoxic Skin: no suspicious lesions or rashes Neuro:sensation intact distally with no dificits, normal muscle tone, no atrophy, strength 5/5 in all tested lower ext groups Psych: normal mood and affect, speech clear  Right hip: No deformity, swelling or wasting ROM Fexion 90, ext 30, IR 45, ER 45 TTP mildly over gluteal musculature, greater trochanter NTTP over the hip flexors,  si joint, lumbar spine Negative log roll with FROM Negative FABER Negative FADIR Negative Piriformis test Negative trendelenberg Gait normal    Electronically signed by:  Benito Mccreedy D.[facility] 10:16 AM 03/24/21

## 2021-03-24 ENCOUNTER — Ambulatory Visit (INDEPENDENT_AMBULATORY_CARE_PROVIDER_SITE_OTHER): Payer: 59 | Admitting: Sports Medicine

## 2021-03-24 ENCOUNTER — Other Ambulatory Visit: Payer: Self-pay

## 2021-03-24 VITALS — BP 128/70 | HR 88 | Ht 62.0 in | Wt 164.0 lb

## 2021-03-24 DIAGNOSIS — M25551 Pain in right hip: Secondary | ICD-10-CM

## 2021-03-24 NOTE — Patient Instructions (Addendum)
Good to see you  Hip strengthening HEP  Tylenol [phone] mg 2-3 times a day for pain relief  As needed follow up

## 2021-05-20 ENCOUNTER — Encounter: Payer: 59 | Admitting: Family Medicine

## 2021-05-26 ENCOUNTER — Ambulatory Visit: Payer: 59 | Admitting: Family Medicine

## 2021-06-09 ENCOUNTER — Ambulatory Visit (INDEPENDENT_AMBULATORY_CARE_PROVIDER_SITE_OTHER): Payer: 59 | Admitting: Family Medicine

## 2021-06-09 ENCOUNTER — Encounter: Payer: Self-pay | Admitting: Family Medicine

## 2021-06-09 VITALS — BP 121/80 | HR 68 | Temp 97.8°F | Ht 62.5 in | Wt 160.0 lb

## 2021-06-09 DIAGNOSIS — E785 Hyperlipidemia, unspecified: Secondary | ICD-10-CM | POA: Diagnosis not present

## 2021-06-09 DIAGNOSIS — Z1211 Encounter for screening for malignant neoplasm of colon: Secondary | ICD-10-CM

## 2021-06-09 DIAGNOSIS — Z0000 Encounter for general adult medical examination without abnormal findings: Secondary | ICD-10-CM

## 2021-06-09 DIAGNOSIS — Z1159 Encounter for screening for other viral diseases: Secondary | ICD-10-CM

## 2021-06-09 DIAGNOSIS — E559 Vitamin D deficiency, unspecified: Secondary | ICD-10-CM | POA: Diagnosis not present

## 2021-06-09 DIAGNOSIS — I700 Atherosclerosis of aorta: Secondary | ICD-10-CM

## 2021-06-09 DIAGNOSIS — Z1231 Encounter for screening mammogram for malignant neoplasm of breast: Secondary | ICD-10-CM

## 2021-06-09 DIAGNOSIS — K760 Fatty (change of) liver, not elsewhere classified: Secondary | ICD-10-CM | POA: Diagnosis not present

## 2021-06-09 LAB — COMPREHENSIVE METABOLIC PANEL
ALT: 24 U/L (ref 0–35)
AST: 16 U/L (ref 0–37)
Albumin: 5 g/dL (ref 3.5–5.2)
Alkaline Phosphatase: 52 U/L (ref 39–117)
BUN: 18 mg/dL (ref 6–23)
CO2: 29 mEq/L (ref 19–32)
Calcium: 9.7 mg/dL (ref 8.4–10.5)
Chloride: 103 mEq/L (ref 96–112)
Creatinine, Ser: 1.03 mg/dL (ref 0.40–1.20)
GFR: 61.12 mL/min (ref >60.00)
Glucose, Bld: 87 mg/dL (ref 70–99)
Potassium: 4 mEq/L (ref 3.5–5.1)
Sodium: 139 mEq/L (ref 135–145)
Total Bilirubin: 0.3 mg/dL (ref 0.2–1.2)
Total Protein: 7.1 g/dL (ref 6.0–8.3)

## 2021-06-09 LAB — HEMOGLOBIN A1C: Hgb A1c MFr Bld: 5.5 % (ref 4.6–6.5)

## 2021-06-09 LAB — VITAMIN D 25 HYDROXY (VIT D DEFICIENCY, FRACTURES): VITD: 74.79 ng/mL (ref 30.00–100.00)

## 2021-06-09 LAB — CBC
HCT: 43.5 % (ref 36.0–46.0)
Hemoglobin: 14.6 g/dL (ref 12.0–15.0)
MCHC: 33.5 g/dL (ref 30.0–36.0)
MCV: 92.9 fl (ref 78.0–100.0)
Platelets: 214 10*3/uL (ref 150.0–400.0)
RBC: 4.69 Mil/uL (ref 3.87–5.11)
RDW: 12.7 % (ref 11.5–15.5)
WBC: 6 10*3/uL (ref 4.0–10.5)

## 2021-06-09 LAB — LIPID PANEL
Cholesterol: 233 mg/dL — ABNORMAL HIGH (ref 0–200)
HDL: 63.8 mg/dL (ref >39.00)
LDL Cholesterol: 156 mg/dL — ABNORMAL HIGH (ref 0–99)
NonHDL: 168.98
Total CHOL/HDL Ratio: 4
Triglycerides: 64 mg/dL (ref 0.0–149.0)
VLDL: 12.8 mg/dL (ref 0.0–40.0)

## 2021-06-09 LAB — TSH: TSH: 0.95 u[IU]/mL (ref 0.35–5.50)

## 2021-06-09 MED ORDER — ALBUTEROL SULFATE HFA 108 (90 BASE) MCG/ACT IN AERS: 1.0000 | INHALATION_SPRAY | Freq: Four times a day (QID) | RESPIRATORY_TRACT | 2 refills | Status: DC | PRN

## 2021-06-09 NOTE — Progress Notes (Signed)
? ? ?Patient ID: Diana Bryant, female  DOB: 04/09/1965, 56 y.o.   MRN: [id number] ?Patient Care Team  ?  Relationship Specialty Notifications Start End  ?Ma Hillock, DO PCP - General Family Medicine  03/09/21   ?Yehuda Savannah, MD (Inactive)  Cardiology  05/27/11   ?Daneil Dolin, MD Consulting Physician Gastroenterology  11/24/15   ?Lucas Mallow, MD Consulting Physician Urology  03/06/21   ? ? ?Chief Complaint  ?Patient presents with  ? Annual Exam  ?  Pt is fasting;   ? ? ?Subjective: ?Diana Bryant is a 56 y.o.  Female  present for CPE/CMC. ?All past medical history, surgical history, allergies, family history, immunizations, medications and social history were updated in the electronic medical record today. ?All recent labs, ED visits and hospitalizations within the last year were reviewed. ? ?Health maintenance:  ?Colonoscopy: no fhx. never screened. Cologuard desired.  ?Mammogram: completed: no fhx. 2018> ordered today ?Cervical cancer screening: hysterectomy.  ?Immunizations: declined immunizations.  ?Infectious disease screening: HIV and  Hep C completed ?DEXA: routine screen. ?Assistive device: none ?Oxygen GQQ:PYPP ?Patient has a Dental home. ?Hospitalizations/ED visits: reviewed ? ? ? ? ?  03/09/2021  ?  9:53 AM 03/09/2021  ?  9:52 AM  ?Depression screen PHQ 2/9  ?Decreased Interest 0 0  ?Down, Depressed, Hopeless 0 0  ?PHQ - 2 Score 0 0  ?Altered sleeping 1   ?Tired, decreased energy 0   ?Change in appetite 0   ?Feeling bad or failure about yourself  0   ?Trouble concentrating 0   ?Moving slowly or fidgety/restless 0   ?Suicidal thoughts 0   ?PHQ-9 Score 1   ? ? ?  03/09/2021  ?  9:53 AM  ?GAD 7 : Generalized Anxiety Score  ?Nervous, Anxious, on Edge 0  ?Control/stop worrying 0  ?Worry too much - different things 0  ?Trouble relaxing 0  ?Restless 0  ?Easily annoyed or irritable 0  ?Afraid - awful might happen 0  ?Total GAD 7 Score 0  ? ? ? ?There is no immunization history on file for this  patient. ? ? ?Past Medical History:  ?Diagnosis Date  ? Abnormal EKG   ? Long QT interval, corrected value of 480 ms  ? Allergies   ? Asthma   ? Chicken pox   ? Hemorrhoids 12/02/2015  ? History of kidney stones 04/21/2016  ? Dr. Phillis Knack -- Uro  ? Hyperlipidemia   ? Lactose intolerance   ? Non-celiac gluten sensitivity   ? Palpitations   ? 05/2011-normal CBC, normal BMet, normal EKG except borderline QT prolongation and  ? Seasonal allergies   ? UTI (urinary tract infection)   ? ?Allergies  ?Allergen Reactions  ? Amoxicillin Anaphylaxis  ?  Has patient had a PCN reaction causing immediate rash, facial/tongue/throat swelling, SOB or lightheadedness with hypotension: {yes ?Has patient had a PCN reaction causing severe rash involving mucus membranes or skin necrosis: {yes ?Has patient had a PCN reaction that required hospitalization {yes ?Has patient had a PCN reaction occurring within the last 10 years: yes ?If all of the above answers are "NO", then may proceed with Cephalosporin use.  ? Levaquin [Levofloxacin In D5w] Anaphylaxis  ? Sulfonamide Derivatives Anaphylaxis and Hives  ? Codeine   ? Contrast Media [Iodinated Contrast Media]   ? Oxycodone-Acetaminophen Hives  ? ?Past Surgical History:  ?Procedure Laterality Date  ? ABDOMINAL HYSTERECTOMY  1997  ? LAPAROSCOPIC APPENDECTOMY  02/1997  ?  LIPOMA EXCISION  2018  ? ?Family History  ?Problem Relation Age of Onset  ? Dementia Mother   ?     vascular  ? Prostate cancer Maternal Grandfather   ? Alcohol abuse Half-Brother   ? Drug abuse Half-Brother   ? ?Social History  ? ?Social History Narrative  ? Marital status/children/pets: Married. G4P3- 4 daughters  ? Education/employment: some college. Retired-sales.   ? Safety:   ?   -smoke alarm in the home:Yes  ?   - wears seatbelt: Yes  ?   - Feels safe in their relationships: Yes  ?   ? ? ?Allergies as of 06/09/2021   ? ?   Reactions  ? Amoxicillin Anaphylaxis  ? Has patient had a PCN reaction causing immediate rash,  facial/tongue/throat swelling, SOB or lightheadedness with hypotension: {yes ?Has patient had a PCN reaction causing severe rash involving mucus membranes or skin necrosis: {yes ?Has patient had a PCN reaction that required hospitalization {yes ?Has patient had a PCN reaction occurring within the last 10 years: yes ?If all of the above answers are "NO", then may proceed with Cephalosporin use.  ? Levaquin [levofloxacin In D5w] Anaphylaxis  ? Sulfonamide Derivatives Anaphylaxis, Hives  ? Codeine   ? Contrast Media [iodinated Contrast Media]   ? Oxycodone-acetaminophen Hives  ? ?  ? ?  ?Medication List  ?  ? ?  ? Accurate as of Jun 09, 2021 12:05 PM. If you have any questions, ask your nurse or doctor.  ?  ?  ? ?  ? ?STOP taking these medications   ? ?diclofenac 75 MG EC tablet ?Commonly known as: VOLTAREN ?Stopped by: Howard Pouch, DO ?  ?fexofenadine 180 MG tablet ?Commonly known as: ALLEGRA ?Stopped by: Howard Pouch, DO ?  ? ?  ? ?TAKE these medications   ? ?albuterol 108 (90 Base) MCG/ACT inhaler ?Commonly known as: VENTOLIN HFA ?Inhale 1-2 puffs into the lungs every 6 (six) hours as needed for wheezing or shortness of breath. ?  ?B-12 PO ?Take 1 tablet by mouth daily. ?  ?fluticasone 50 MCG/ACT nasal spray ?Commonly known as: FLONASE ?Place 1 spray into both nostrils 2 (two) times daily as needed for allergies. ?  ?OVER THE COUNTER MEDICATION ?  ?PROBIOTIC-10 PO ?Take 1 tablet by mouth 2 (two) times daily. ?  ?QC TUMERIC COMPLEX PO ?Take 1 tablet by mouth daily. ?  ?UNABLE TO FIND ?Apply 1 application topically 2 (two) times daily. Med Name: beta yam ?  ?UNABLE TO FIND ?Take 1 tablet by mouth 2 (two) times daily. Med Name: stress care herbs ?  ?VITAMIN C PO ?Take 1 tablet by mouth daily. ?  ?VITAMIN D PO ?Take 1 capsule by mouth daily. ?  ? ?  ? ? ?All past medical history, surgical history, allergies, family history, immunizations andmedications were updated in the EMR today and reviewed under the history and  medication portions of their EMR.    ? ?No results found for this or any previous visit (from the past 2160 hour(s)). ? ?CT Abdomen Pelvis Wo Contrast ? ?Result Date: 03/07/2021 ?Marland Kitchen IMPRESSION: 1. No acute localizing process in the abdomen or pelvis. 2. Unchanged fatty infiltration of the liver. 3.  Aortic Atherosclerosis (ICD10-I70.0). Electronically Signed   By: Ronney Asters M.D.   On: 03/07/2021 20:55  ? ?ROS ?14 pt review of systems performed and negative (unless mentioned in an HPI) ? ?Objective: ?BP 121/80   Pulse 68   Temp 97.8 ?F (36.6 ?  C) (Oral)   Ht 5' 2.5" (1.588 m)   Wt 160 lb (72.6 kg)   SpO2 98%   BMI 28.80 kg/m?  ?Physical Exam ?Vitals and nursing note reviewed.  ?Constitutional:   ?   General: She is not in acute distress. ?   Appearance: Normal appearance. She is not ill-appearing or toxic-appearing.  ?HENT:  ?   Head: Normocephalic and atraumatic.  ?   Right Ear: Tympanic membrane, ear canal and external ear normal. There is no impacted cerumen.  ?   Left Ear: Tympanic membrane, ear canal and external ear normal. There is no impacted cerumen.  ?   Nose: No congestion or rhinorrhea.  ?   Mouth/Throat:  ?   Mouth: Mucous membranes are moist.  ?   Pharynx: Oropharynx is clear. No oropharyngeal exudate or posterior oropharyngeal erythema.  ?Eyes:  ?   General: No scleral icterus.    ?   Right eye: No discharge.     ?   Left eye: No discharge.  ?   Extraocular Movements: Extraocular movements intact.  ?   Conjunctiva/sclera: Conjunctivae normal.  ?   Pupils: Pupils are equal, round, and reactive to light.  ?Cardiovascular:  ?   Rate and Rhythm: Normal rate and regular rhythm.  ?   Pulses: Normal pulses.  ?   Heart sounds: Normal heart sounds. No murmur heard. ?  No friction rub. No gallop.  ?Pulmonary:  ?   Effort: Pulmonary effort is normal. No respiratory distress.  ?   Breath sounds: Normal breath sounds. No stridor. No wheezing, rhonchi or rales.  ?Chest:  ?   Chest wall: No tenderness.   ?Abdominal:  ?   General: Abdomen is flat. Bowel sounds are normal. There is no distension.  ?   Palpations: Abdomen is soft. There is no mass.  ?   Tenderness: There is no abdominal tenderness. There is no right CVA tende

## 2021-06-09 NOTE — Patient Instructions (Signed)
Return in about 1 year (around 06/11/2022) for cpe (20 min). ? ? ? ? ? ? ? ?Great to see you today.  ?I have refilled the medication(s) we provide.  ? ?If labs were collected, we will inform you of lab results once received either by echart message or telephone call.  ? - echart message- for normal results that have been seen by the patient already.  ? - telephone call: abnormal results or if patient has not viewed results in their echart. ? ?Health Maintenance, Female ?Adopting a healthy lifestyle and getting preventive care are important in promoting health and wellness. Ask your health care provider about: ?The right schedule for you to have regular tests and exams. ?Things you can do on your own to prevent diseases and keep yourself healthy. ?What should I know about diet, weight, and exercise? ?Eat a healthy diet ? ?Eat a diet that includes plenty of vegetables, fruits, low-fat dairy products, and lean protein. ?Do not eat a lot of foods that are high in solid fats, added sugars, or sodium. ?Maintain a healthy weight ?Body mass index (BMI) is used to identify weight problems. It estimates body fat based on height and weight. Your health care provider can help determine your BMI and help you achieve or maintain a healthy weight. ?Get regular exercise ?Get regular exercise. This is one of the most important things you can do for your health. Most adults should: ?Exercise for at least 150 minutes each week. The exercise should increase your heart rate and make you sweat (moderate-intensity exercise). ?Do strengthening exercises at least twice a week. This is in addition to the moderate-intensity exercise. ?Spend less time sitting. Even light physical activity can be beneficial. ?Watch cholesterol and blood lipids ?Have your blood tested for lipids and cholesterol at 56 years of age, then have this test every 5 years. ?Have your cholesterol levels checked more often if: ?Your lipid or cholesterol levels are  high. ?You are older than 57 years of age. ?You are at high risk for heart disease. ?What should I know about cancer screening? ?Depending on your health history and family history, you may need to have cancer screening at various ages. This may include screening for: ?Breast cancer. ?Cervical cancer. ?Colorectal cancer. ?Skin cancer. ?Lung cancer. ?What should I know about heart disease, diabetes, and high blood pressure? ?Blood pressure and heart disease ?High blood pressure causes heart disease and increases the risk of stroke. This is more likely to develop in people who have high blood pressure readings or are overweight. ?Have your blood pressure checked: ?Every 3-5 years if you are 76-54 years of age. ?Every year if you are 15 years old or older. ?Diabetes ?Have regular diabetes screenings. This checks your fasting blood sugar level. Have the screening done: ?Once every three years after age 43 if you are at a normal weight and have a low risk for diabetes. ?More often and at a younger age if you are overweight or have a high risk for diabetes. ?What should I know about preventing infection? ?Hepatitis B ?If you have a higher risk for hepatitis B, you should be screened for this virus. Talk with your health care provider to find out if you are at risk for hepatitis B infection. ?Hepatitis C ?Testing is recommended for: ?Everyone born from [birth year] through 1965. ?Anyone with known risk factors for hepatitis C. ?Sexually transmitted infections (STIs) ?Get screened for STIs, including gonorrhea and chlamydia, if: ?You are sexually active and are younger  than 56 years of age. ?You are older than 56 years of age and your health care provider tells you that you are at risk for this type of infection. ?Your sexual activity has changed since you were last screened, and you are at increased risk for chlamydia or gonorrhea. Ask your health care provider if you are at risk. ?Ask your health care provider about whether you  are at high risk for HIV. Your health care provider may recommend a prescription medicine to help prevent HIV infection. If you choose to take medicine to prevent HIV, you should first get tested for HIV. You should then be tested every 3 months for as long as you are taking the medicine. ?Pregnancy ?If you are about to stop having your period (premenopausal) and you may become pregnant, seek counseling before you get pregnant. ?Take 400 to 800 micrograms (mcg) of folic acid every day if you become pregnant. ?Ask for birth control (contraception) if you want to prevent pregnancy. ?Osteoporosis and menopause ?Osteoporosis is a disease in which the bones lose minerals and strength with aging. This can result in bone fractures. If you are 36 years old or older, or if you are at risk for osteoporosis and fractures, ask your health care provider if you should: ?Be screened for bone loss. ?Take a calcium or vitamin D supplement to lower your risk of fractures. ?Be given hormone replacement therapy (HRT) to treat symptoms of menopause. ?Follow these instructions at home: ?Alcohol use ?Do not drink alcohol if: ?Your health care provider tells you not to drink. ?You are pregnant, may be pregnant, or are planning to become pregnant. ?If you drink alcohol: ?Limit how much you have to: ?0-1 drink a day. ?Know how much alcohol is in your drink. In the [place] one drink equals one 12 oz bottle of beer (355 mL), one 5 oz glass of wine (148 mL), or one 1? oz glass of hard liquor (44 mL). ?Lifestyle ?Do not use any products that contain nicotine or tobacco. These products include cigarettes, chewing tobacco, and vaping devices, such as e-cigarettes. If you need help quitting, ask your health care provider. ?Do not use street drugs. ?Do not share needles. ?Ask your health care provider for help if you need support or information about quitting drugs. ?General instructions ?Schedule regular health, dental, and eye exams. ?Stay current  with your vaccines. ?Tell your health care provider if: ?You often feel depressed. ?You have ever been abused or do not feel safe at home. ?Summary ?Adopting a healthy lifestyle and getting preventive care are important in promoting health and wellness. ?Follow your health care provider's instructions about healthy diet, exercising, and getting tested or screened for diseases. ?Follow your health care provider's instructions on monitoring your cholesterol and blood pressure. ?This information is not intended to replace advice given to you by your health care provider. Make sure you discuss any questions you have with your health care provider. ?Document Revised: 06/16/2020 Document Reviewed: 06/16/2020 ?Elsevier Patient Education ? [place]. ? ?

## 2021-06-10 ENCOUNTER — Telehealth: Payer: Self-pay | Admitting: Family Medicine

## 2021-06-10 LAB — HEPATITIS C ANTIBODY
Hepatitis C Ab: NONREACTIVE
SIGNAL TO CUT-OFF: 0.06 (ref <1.00)

## 2021-06-10 NOTE — Telephone Encounter (Signed)
Please call patient ?Liver, kidney and thyroid function are normal ?Vitamin D levels look excellent at 74. ?Blood cell counts and electrolytes are normal ?Diabetes screening/A1c is normal  ?Hep C screen is negative ?Cholesterol panel is above goal for her.  Her HDL/good cholesterol is excellent at 63.8.  Her triglycerides look excellent at 64.  Her LDL/bad cholesterol is 156, this is above goal for her.  Goal LDL would be less than 135, closer to 100 would be ideal. ?  -American Heart Association recommends at least 150 minutes of exercise weekly, high fiber diet with plenty of fresh fruits and vegetables.  Limit consumption of red meat, butter and fatty meats. ? ? - There is an over-the-counter supplement called red yeast rice that also can help lower the LDL cholesterol along with the diet and exercise modifications mentioned above.  I would recommend she try this first, and follow-up in 4 months for recheck.  If not at goal at that time after making modifications then we would need to discuss considering a prescribed medication to lower her LDL and provide her cardiovascular protection. ?

## 2021-06-11 NOTE — Telephone Encounter (Signed)
LVM for pt to CB regarding results.  

## 2021-06-12 NOTE — Telephone Encounter (Signed)
Spoke with pt regarding labs and instructions.   

## 2021-07-02 LAB — COLOGUARD: COLOGUARD: NEGATIVE

## 2021-07-17 ENCOUNTER — Ambulatory Visit (HOSPITAL_BASED_OUTPATIENT_CLINIC_OR_DEPARTMENT_OTHER)
Admission: RE | Admit: 2021-07-17 | Discharge: 2021-07-17 | Disposition: A | Discharge status: Home / Self Care | Payer: 59 | Source: Ambulatory Visit | Attending: Family Medicine | Admitting: Family Medicine

## 2021-07-17 ENCOUNTER — Ambulatory Visit (HOSPITAL_BASED_OUTPATIENT_CLINIC_OR_DEPARTMENT_OTHER): Admission: RE | Admit: 2021-07-17 | Payer: 59 | Source: Ambulatory Visit | Admitting: Radiology

## 2021-07-17 ENCOUNTER — Encounter (HOSPITAL_BASED_OUTPATIENT_CLINIC_OR_DEPARTMENT_OTHER): Payer: Self-pay

## 2021-07-17 ENCOUNTER — Other Ambulatory Visit (HOSPITAL_BASED_OUTPATIENT_CLINIC_OR_DEPARTMENT_OTHER): Payer: Self-pay | Admitting: Family Medicine

## 2021-07-17 DIAGNOSIS — Z1231 Encounter for screening mammogram for malignant neoplasm of breast: Secondary | ICD-10-CM | POA: Insufficient documentation

## 2021-10-14 ENCOUNTER — Ambulatory Visit: Payer: 59 | Admitting: Family Medicine

## 2021-10-15 ENCOUNTER — Ambulatory Visit: Payer: 59 | Admitting: Family Medicine

## 2022-01-25 ENCOUNTER — Ambulatory Visit: Payer: 59 | Admitting: Family Medicine

## 2022-01-25 ENCOUNTER — Encounter: Payer: Self-pay | Admitting: Family Medicine

## 2022-01-25 VITALS — BP 125/78 | HR 85 | Temp 98.6°F | Ht 62.5 in | Wt 165.0 lb

## 2022-01-25 DIAGNOSIS — R051 Acute cough: Secondary | ICD-10-CM

## 2022-01-25 DIAGNOSIS — B9689 Other specified bacterial agents as the cause of diseases classified elsewhere: Secondary | ICD-10-CM | POA: Diagnosis not present

## 2022-01-25 DIAGNOSIS — J988 Other specified respiratory disorders: Secondary | ICD-10-CM

## 2022-01-25 MED ORDER — AZITHROMYCIN 250 MG PO TABS: ORAL_TABLET | ORAL | 0 refills | Status: AC

## 2022-01-25 MED ORDER — IPRATROPIUM-ALBUTEROL 0.5-2.5 (3) MG/3ML IN SOLN
3.0000 mL | Freq: Once | RESPIRATORY_TRACT | Status: AC
  Administered 2022-01-25: 3 mL via RESPIRATORY_TRACT

## 2022-01-25 MED ORDER — METHYLPREDNISOLONE ACETATE 80 MG/ML IJ SUSP
80.0000 mg | Freq: Once | INTRAMUSCULAR | Status: AC
  Administered 2022-01-25: 80 mg via INTRAMUSCULAR

## 2022-01-25 NOTE — Progress Notes (Signed)
Diana Bryant , 1965-03-22, 56 y.o., female MRN: [id number] Patient Care Team    Relationship Specialty Notifications Start End  Ma Hillock, DO PCP - General Family Medicine  03/09/21   Yehuda Savannah, MD (Inactive)  Cardiology  05/27/11   Daneil Dolin, MD Consulting Physician Gastroenterology  11/24/15   Lucas Mallow, MD Consulting Physician Urology  03/06/21     Chief Complaint  Patient presents with   Cough    Pt c/o cough, sinus pressure, nasal congestion b/l ear pain, post nasal drip x 2 weeks;      Subjective: Pt presents for an OV with complaints of cough that is productive in the morning.  She also endorses sinus pressure, nasal congestion, bilateral ear pain and postnasal drip of 2 weeks duration.  She has a past medical history of asthma.  She has an albuterol inhaler which she started last week on occasions when needed and wheezing.  She states she feels very fatigued.  She is allergic to quite a few antibiotics with anaphylaxis, therefore antibiotic she has has her quite concerned.  She has not needed an antibiotic in over 6 years.  She has not had prednisone tapers in the past that make her quite jittery, but she is able to tolerate Depo-Medrol injections.     01/25/2022    2:56 PM 03/09/2021    9:53 AM 03/09/2021    9:52 AM  Depression screen PHQ 2/9  Decreased Interest 0 0 0  Down, Depressed, Hopeless 0 0 0  PHQ - 2 Score 0 0 0  Altered sleeping  1   Tired, decreased energy  0   Change in appetite  0   Feeling bad or failure about yourself   0   Trouble concentrating  0   Moving slowly or fidgety/restless  0   Suicidal thoughts  0   PHQ-9 Score  1     Allergies  Allergen Reactions   Amoxicillin Anaphylaxis    Has patient had a PCN reaction causing immediate rash, facial/tongue/throat swelling, SOB or lightheadedness with hypotension: {yes Has patient had a PCN reaction causing severe rash involving mucus membranes or skin necrosis:  {yes Has patient had a PCN reaction that required hospitalization {yes Has patient had a PCN reaction occurring within the last 10 years: yes If all of the above answers are "NO", then may proceed with Cephalosporin use.   Levaquin [Levofloxacin In D5w] Anaphylaxis   Sulfonamide Derivatives Anaphylaxis and Hives   Codeine    Contrast Media [Iodinated Contrast Media]    Oxycodone-Acetaminophen Hives   Social History   Social History Narrative   Marital status/children/pets: Married. G4P3- 4 daughters   Education/employment: some college. Retired-sales.    Safety:      -smoke alarm in the home:Yes     - wears seatbelt: Yes     - Feels safe in their relationships: Yes      Past Medical History:  Diagnosis Date   Abnormal EKG    Long QT interval, corrected value of 480 ms   Allergies    Asthma    Chicken pox    Hemorrhoids 12/02/2015   History of kidney stones 04/21/2016   Dr. Phillis Knack -- Uro   Hyperlipidemia    Lactose intolerance    Non-celiac gluten sensitivity    Palpitations    05/2011-normal CBC, normal BMet, normal EKG except borderline QT prolongation and   Seasonal allergies  UTI (urinary tract infection)    Past Surgical History:  Procedure Laterality Date   ABDOMINAL HYSTERECTOMY  1997   LAPAROSCOPIC APPENDECTOMY  02/1997   LIPOMA EXCISION  2018   Family History  Problem Relation Age of Onset   Dementia Mother        vascular   Prostate cancer Maternal Grandfather    Alcohol abuse Half-Brother    Drug abuse Half-Brother    Allergies as of 01/25/2022       Reactions   Amoxicillin Anaphylaxis   Has patient had a PCN reaction causing immediate rash, facial/tongue/throat swelling, SOB or lightheadedness with hypotension: {yes Has patient had a PCN reaction causing severe rash involving mucus membranes or skin necrosis: {yes Has patient had a PCN reaction that required hospitalization {yes Has patient had a PCN reaction occurring within the last 10 years:  yes If all of the above answers are "NO", then may proceed with Cephalosporin use.   Levaquin [levofloxacin In D5w] Anaphylaxis   Sulfonamide Derivatives Anaphylaxis, Hives   Codeine    Contrast Media [iodinated Contrast Media]    Oxycodone-acetaminophen Hives        Medication List        Accurate as of January 25, 2022  4:55 PM. If you have any questions, ask your nurse or doctor.          albuterol 108 (90 Base) MCG/ACT inhaler Commonly known as: VENTOLIN HFA Inhale 1-2 puffs into the lungs every 6 (six) hours as needed for wheezing or shortness of breath.   azithromycin 250 MG tablet Commonly known as: ZITHROMAX Take 2 tablets on day 1, then 1 tablet daily on days 2 through 5 Started by: Howard Pouch, DO   B-12 PO Take 1 tablet by mouth daily.   fluticasone 50 MCG/ACT nasal spray Commonly known as: FLONASE Place 1 spray into both nostrils 2 (two) times daily as needed for allergies.   OVER THE COUNTER MEDICATION   PROBIOTIC-10 PO Take 1 tablet by mouth 2 (two) times daily.   QC TUMERIC COMPLEX PO Take 1 tablet by mouth daily.   UNABLE TO FIND Apply 1 application topically 2 (two) times daily. Med Name: beta yam   UNABLE TO FIND Take 1 tablet by mouth 2 (two) times daily. Med Name: stress care herbs   VITAMIN C PO Take 1 tablet by mouth daily.   VITAMIN D PO Take 1 capsule by mouth daily.        All past medical history, surgical history, allergies, family history, immunizations andmedications were updated in the EMR today and reviewed under the history and medication portions of their EMR.     ROS Negative, with the exception of above mentioned in HPI   Objective:  BP 125/78   Pulse 85   Temp 98.6 F (37 C) (Oral)   Ht 5' 2.5" (1.588 m)   Wt 165 lb (74.8 kg)   SpO2 97%   BMI 29.70 kg/m  Body mass index is 29.7 kg/m. Physical Exam Vitals and nursing note reviewed.  Constitutional:      General: She is not in acute distress.     Appearance: Normal appearance. She is not ill-appearing, toxic-appearing or diaphoretic.  HENT:     Head: Normocephalic and atraumatic.     Right Ear: Tympanic membrane and ear canal normal.     Left Ear: Tympanic membrane and ear canal normal.     Nose: Congestion and rhinorrhea present.     Mouth/Throat:  Pharynx: No oropharyngeal exudate or posterior oropharyngeal erythema.     Comments: Postnasal drip present Eyes:     General: No scleral icterus.       Right eye: No discharge.        Left eye: No discharge.     Extraocular Movements: Extraocular movements intact.     Conjunctiva/sclera: Conjunctivae normal.     Pupils: Pupils are equal, round, and reactive to light.  Cardiovascular:     Rate and Rhythm: Normal rate and regular rhythm.     Heart sounds: No murmur heard. Pulmonary:     Effort: Pulmonary effort is normal. No respiratory distress.     Breath sounds: Wheezing present. No rhonchi or rales.  Musculoskeletal:     Cervical back: Neck supple. No tenderness.  Lymphadenopathy:     Cervical: No cervical adenopathy.  Skin:    General: Skin is warm and dry.     Coloration: Skin is not jaundiced or pale.     Findings: No erythema or rash.  Neurological:     Mental Status: She is alert and oriented to person, place, and time. Mental status is at baseline.     Motor: No weakness.     Gait: Gait normal.  Psychiatric:        Mood and Affect: Mood normal.        Behavior: Behavior normal.        Thought Content: Thought content normal.        Judgment: Judgment normal.      No results found. No results found. No results found for this or any previous visit (from the past 24 hour(s)).  Assessment/Plan:  Diana Bryant is a 56 y.o. female present for OV for  Acute cough/Bacterial respiratory infection Rest, hydrate.  mucinex (DM if cough), nettie pot or nasal saline.  Azithromycin is safe for her to take-if not effective would then consider  doxycycline-prescribed, take until completed.  IM Depo-Medrol injection provided today.  DuoNeb x 1 opened up airways nicely and patient felt improvement. Encourage her to use her albuterol inhaler scheduled every 4-8 hours over the next 2 to 3 days to keep lungs open and then can use as needed. If cough present it can last up to 6-8 weeks.  F/U 2 weeks if not improved.   - methylPREDNISolone acetate (DEPO-MEDROL) injection 80 mg - ipratropium-albuterol (DUONEB) 0.5-2.5 (3) MG/3ML nebulizer solution 3 mL    Reviewed expectations re: course of current medical issues. Discussed self-management of symptoms. Outlined signs and symptoms indicating need for more acute intervention. Patient verbalized understanding and all questions were answered. Patient received an After-Visit Summary.    No orders of the defined types were placed in this encounter.  Meds ordered this encounter  Medications   azithromycin (ZITHROMAX) 250 MG tablet    Sig: Take 2 tablets on day 1, then 1 tablet daily on days 2 through 5    Dispense:  6 tablet    Refill:  0   methylPREDNISolone acetate (DEPO-MEDROL) injection 80 mg   ipratropium-albuterol (DUONEB) 0.5-2.5 (3) MG/3ML nebulizer solution 3 mL   Referral Orders  No referral(s) requested today     Note is dictated utilizing voice recognition software. Although note has been proof read prior to signing, occasional typographical errors still can be missed. If any questions arise, please do not hesitate to call for verification.   electronically signed by:  Howard Pouch, DO  [place]

## 2022-01-25 NOTE — Patient Instructions (Signed)
No follow-ups on file.        Great to see you today.  I have refilled the medication(s) we provide.   If labs were collected, we will inform you of lab results once received either by echart message or telephone call.   - echart message- for normal results that have been seen by the patient already.   - telephone call: abnormal results or if patient has not viewed results in their echart.  

## 2022-03-05 ENCOUNTER — Other Ambulatory Visit: Payer: Self-pay | Admitting: Family Medicine

## 2022-07-19 DIAGNOSIS — M9904 Segmental and somatic dysfunction of sacral region: Secondary | ICD-10-CM | POA: Diagnosis not present

## 2022-07-19 DIAGNOSIS — M5431 Sciatica, right side: Secondary | ICD-10-CM | POA: Diagnosis not present

## 2022-08-16 DIAGNOSIS — M5431 Sciatica, right side: Secondary | ICD-10-CM | POA: Diagnosis not present

## 2022-08-16 DIAGNOSIS — M9904 Segmental and somatic dysfunction of sacral region: Secondary | ICD-10-CM | POA: Diagnosis not present

## 2022-08-30 DIAGNOSIS — M5431 Sciatica, right side: Secondary | ICD-10-CM | POA: Diagnosis not present

## 2022-08-30 DIAGNOSIS — M9904 Segmental and somatic dysfunction of sacral region: Secondary | ICD-10-CM | POA: Diagnosis not present

## 2022-09-13 DIAGNOSIS — M9904 Segmental and somatic dysfunction of sacral region: Secondary | ICD-10-CM | POA: Diagnosis not present

## 2022-09-13 DIAGNOSIS — M5431 Sciatica, right side: Secondary | ICD-10-CM | POA: Diagnosis not present

## 2022-09-28 DIAGNOSIS — M5431 Sciatica, right side: Secondary | ICD-10-CM | POA: Diagnosis not present

## 2022-09-28 DIAGNOSIS — M9904 Segmental and somatic dysfunction of sacral region: Secondary | ICD-10-CM | POA: Diagnosis not present

## 2022-10-12 DIAGNOSIS — M5431 Sciatica, right side: Secondary | ICD-10-CM | POA: Diagnosis not present

## 2022-10-12 DIAGNOSIS — M9904 Segmental and somatic dysfunction of sacral region: Secondary | ICD-10-CM | POA: Diagnosis not present

## 2023-07-26 ENCOUNTER — Ambulatory Visit: Admitting: Family Medicine

## 2023-07-26 ENCOUNTER — Encounter: Payer: Self-pay | Admitting: Family Medicine

## 2023-07-26 VITALS — BP 124/80 | HR 82 | Temp 97.9°F | Wt 161.8 lb

## 2023-07-26 DIAGNOSIS — B9689 Other specified bacterial agents as the cause of diseases classified elsewhere: Secondary | ICD-10-CM

## 2023-07-26 DIAGNOSIS — R051 Acute cough: Secondary | ICD-10-CM | POA: Diagnosis not present

## 2023-07-26 DIAGNOSIS — J988 Other specified respiratory disorders: Secondary | ICD-10-CM | POA: Diagnosis not present

## 2023-07-26 MED ORDER — METHYLPREDNISOLONE ACETATE 80 MG/ML IJ SUSP
80.0000 mg | Freq: Once | INTRAMUSCULAR | Status: AC
  Administered 2023-07-26: 80 mg via INTRAMUSCULAR

## 2023-07-26 MED ORDER — AZITHROMYCIN 250 MG PO TABS: ORAL_TABLET | ORAL | 0 refills | Status: AC

## 2023-07-26 MED ORDER — ALBUTEROL SULFATE HFA 108 (90 BASE) MCG/ACT IN AERS: 1.0000 | INHALATION_SPRAY | Freq: Four times a day (QID) | RESPIRATORY_TRACT | 2 refills | Status: AC | PRN

## 2023-07-26 NOTE — Progress Notes (Signed)
 Diana Bryant , 24-Aug-1965, 58 y.o., female MRN: [id number] Patient Care Team    Relationship Specialty Notifications Start End  Mariel Shope, DO PCP - General Family Medicine  03/09/21   Natha Bair, MD (Inactive)  Cardiology  05/27/11   Suzette Espy, MD Consulting Physician Gastroenterology  11/24/15   Samson Croak, MD Consulting Physician Urology  03/06/21     Chief Complaint  Patient presents with   Nasal Congestion    Over a week; cough, nasal/chest congestion, drainage. Pt has taken Mucinex and Sudafed Tylenol .      Subjective: Diana Bryant is a 58 y.o. Pt presents for an OV with complaints of sinus pain of 1 week duration.  Associated symptoms include Cough-productive and wheezing Pt has tried albuterol  inhaler , Flonase to ease their symptoms. Albuterol  has helped     01/25/2022    2:56 PM 03/09/2021    9:53 AM 03/09/2021    9:52 AM  Depression screen PHQ 2/9  Decreased Interest 0 0 0  Down, Depressed, Hopeless 0 0 0  PHQ - 2 Score 0 0 0  Altered sleeping  1   Tired, decreased energy  0   Change in appetite  0   Feeling bad or failure about yourself   0   Trouble concentrating  0   Moving slowly or fidgety/restless  0   Suicidal thoughts  0   PHQ-9 Score  1     Allergies  Allergen Reactions   Amoxicillin Anaphylaxis    Has patient had a PCN reaction causing immediate rash, facial/tongue/throat swelling, SOB or lightheadedness with hypotension: {yes Has patient had a PCN reaction causing severe rash involving mucus membranes or skin necrosis: {yes Has patient had a PCN reaction that required hospitalization {yes Has patient had a PCN reaction occurring within the last 10 years: yes If all of the above answers are NO, then may proceed with Cephalosporin use.   Levaquin [Levofloxacin In D5w] Anaphylaxis   Sulfonamide Derivatives Anaphylaxis and Hives   Codeine    Contrast Media [Iodinated Contrast Media]    Oxycodone-Acetaminophen   Hives   Social History   Social History Narrative   Marital status/children/pets: Married. G4P3- 4 daughters   Education/employment: some college. Retired-sales.    Safety:      -smoke alarm in the home:Yes     - wears seatbelt: Yes     - Feels safe in their relationships: Yes      Past Medical History:  Diagnosis Date   Abnormal EKG    Long QT interval, corrected value of 480 ms   Allergies    Asthma    Chicken pox    Hemorrhoids 12/02/2015   History of kidney stones 04/21/2016   Dr. Royce Coria -- Uro   Hyperlipidemia    Lactose intolerance    Non-celiac gluten sensitivity    Palpitations    05/2011-normal CBC, normal BMet, normal EKG except borderline QT prolongation and   Seasonal allergies    UTI (urinary tract infection)    Past Surgical History:  Procedure Laterality Date   ABDOMINAL HYSTERECTOMY  1997   LAPAROSCOPIC APPENDECTOMY  02/1997   LIPOMA EXCISION  2018   Family History  Problem Relation Age of Onset   Dementia Mother        vascular   Prostate cancer Maternal Grandfather    Alcohol abuse Half-Brother    Drug abuse Half-Brother    Allergies as  of 07/26/2023       Reactions   Amoxicillin Anaphylaxis   Has patient had a PCN reaction causing immediate rash, facial/tongue/throat swelling, SOB or lightheadedness with hypotension: {yes Has patient had a PCN reaction causing severe rash involving mucus membranes or skin necrosis: {yes Has patient had a PCN reaction that required hospitalization {yes Has patient had a PCN reaction occurring within the last 10 years: yes If all of the above answers are NO, then may proceed with Cephalosporin use.   Levaquin [levofloxacin In D5w] Anaphylaxis   Sulfonamide Derivatives Anaphylaxis, Hives   Codeine    Contrast Media [iodinated Contrast Media]    Oxycodone-acetaminophen  Hives        Medication List        Accurate as of July 26, 2023  8:43 AM. If you have any questions, ask your nurse or doctor.           albuterol  108 (90 Base) MCG/ACT inhaler Commonly known as: VENTOLIN  HFA Inhale 1-2 puffs into the lungs every 6 (six) hours as needed for wheezing or shortness of breath.   azithromycin  250 MG tablet Commonly known as: ZITHROMAX  Take 2 tablets on day 1, then 1 tablet daily on days 2 through 5 Started by: Jamel Dunton   B-12 PO Take 1 tablet by mouth daily.   fluticasone 50 MCG/ACT nasal spray Commonly known as: FLONASE Place 1 spray into both nostrils 2 (two) times daily as needed for allergies.   OVER THE COUNTER MEDICATION   PROBIOTIC-10 PO Take 1 tablet by mouth 2 (two) times daily.   QC TUMERIC COMPLEX PO Take 1 tablet by mouth daily.   UNABLE TO FIND Apply 1 application topically 2 (two) times daily. Med Name: beta yam   UNABLE TO FIND Take 1 tablet by mouth 2 (two) times daily. Med Name: stress care herbs   VITAMIN C PO Take 1 tablet by mouth daily.   VITAMIN D  PO Take 1 capsule by mouth daily.        All past medical history, surgical history, allergies, family history, immunizations andmedications were updated in the EMR today and reviewed under the history and medication portions of their EMR.     Review of Systems  Constitutional:  Positive for malaise/fatigue. Negative for chills and fever.  HENT:  Positive for congestion, ear pain and sinus pain. Negative for sore throat.   Eyes: Negative.   Respiratory:  Positive for cough, sputum production and wheezing.   Gastrointestinal: Negative.   Genitourinary: Negative.   Musculoskeletal: Negative.   Skin:  Negative for rash.  Neurological:  Positive for headaches. Negative for dizziness.   Negative, with the exception of above mentioned in HPI   Objective:  BP 124/80   Pulse 82   Temp 97.9 F (36.6 C)   Wt 161 lb 12.8 oz (73.4 kg)   SpO2 97%   BMI 29.12 kg/m  Body mass index is 29.12 kg/m.  Physical Exam Vitals and nursing note reviewed.  Constitutional:      General: She is not in  acute distress.    Appearance: Normal appearance. She is normal weight. She is not ill-appearing or toxic-appearing.  HENT:     Head: Normocephalic and atraumatic.     Right Ear: Tympanic membrane, ear canal and external ear normal.     Left Ear: Tympanic membrane, ear canal and external ear normal.     Nose: Congestion and rhinorrhea present.     Right Turbinates: Swollen.  Left Turbinates: Swollen.     Right Sinus: Maxillary sinus tenderness present.     Left Sinus: Maxillary sinus tenderness present.     Mouth/Throat:     Mouth: Mucous membranes are moist.     Pharynx: No oropharyngeal exudate or posterior oropharyngeal erythema.     Comments: PND  Eyes:     General: No scleral icterus.       Right eye: No discharge.        Left eye: No discharge.     Extraocular Movements: Extraocular movements intact.     Conjunctiva/sclera: Conjunctivae normal.     Pupils: Pupils are equal, round, and reactive to light.    Cardiovascular:     Rate and Rhythm: Normal rate and regular rhythm.     Heart sounds: No murmur heard. Pulmonary:     Effort: Pulmonary effort is normal. No respiratory distress.     Breath sounds: Rhonchi present. No wheezing or rales.   Musculoskeletal:     Cervical back: Neck supple.     Right lower leg: No edema.     Left lower leg: No edema.  Lymphadenopathy:     Cervical: Cervical adenopathy present.   Skin:    Findings: No rash.   Neurological:     Mental Status: She is alert and oriented to person, place, and time. Mental status is at baseline.     Motor: No weakness.     Coordination: Coordination normal.     Gait: Gait normal.   Psychiatric:        Mood and Affect: Mood normal.        Behavior: Behavior normal.        Thought Content: Thought content normal.        Judgment: Judgment normal.     No results found. No results found. No results found for this or any previous visit (from the past 24 hours).  Assessment/Plan: Diana Bryant is a 58 y.o. female present for OV for  Bacterial respiratory infection (Primary)/Acute cough Rest, hydrate.  +/- flonase, mucinex (DM if cough), nettie pot or nasal saline.  Azith  prescribed, take until completed.  Albuterol  q6 prn IM dpeo medrol  80 provided today If cough present it can last up to 6-8 weeks.  F/U 2 weeks of not improved.   Reviewed expectations re: course of current medical issues. Discussed self-management of symptoms. Outlined signs and symptoms indicating need for more acute intervention. Patient verbalized understanding and all questions were answered. Patient received an After-Visit Summary.    No orders of the defined types were placed in this encounter.  Meds ordered this encounter  Medications   albuterol  (VENTOLIN  HFA) 108 (90 Base) MCG/ACT inhaler    Sig: Inhale 1-2 puffs into the lungs every 6 (six) hours as needed for wheezing or shortness of breath.    Dispense:  1 each    Refill:  2   azithromycin  (ZITHROMAX ) 250 MG tablet    Sig: Take 2 tablets on day 1, then 1 tablet daily on days 2 through 5    Dispense:  6 tablet    Refill:  0   methylPREDNISolone  acetate (DEPO-MEDROL ) injection 80 mg   Referral Orders  No referral(s) requested today     Note is dictated utilizing voice recognition software. Although note has been proof read prior to signing, occasional typographical errors still can be missed. If any questions arise, please do not hesitate to call for verification.   electronically signed  by:  Napolean Backbone, DO  [facility] [place]

## 2023-07-26 NOTE — Patient Instructions (Addendum)

## 2024-01-31 ENCOUNTER — Ambulatory Visit: Payer: Self-pay

## 2024-01-31 NOTE — Telephone Encounter (Signed)
" °  FYI Only or Action Required?: FYI only for provider: appointment scheduled on 02/01/24.  Patient was last seen in primary care on 07/26/2023 by Catherine Fuller A, DO.  Called Nurse Triage reporting Otalgia.  Symptoms began today.  Interventions attempted: OTC medications: tylenol , sudefed.  Symptoms are: gradually worsening.  Triage Disposition: See Physician Within 24 Hours  Patient/caregiver understands and will follow disposition?: Yes     Copied from CRM #[id number]. Topic: Clinical - Red Word Triage >> Jan 31, 2024  4:10 PM Jayma L wrote: Red Word that prompted transfer to Nurse Triage: Appt needed - needed today , ear infection are plugged up , teeth are hurting a possible sinus infection , headache from pressure - pain in ear is worse on right side   Reason for Disposition  Earache  (Exceptions: Brief ear pain of lasting less than 60 minutes, or earache occurring during air travel.)  Answer Assessment - Initial Assessment Questions 1. LOCATION: Which ear is involved?     Rt ear 2. ONSET: When did the ear pain start?      today 3. SEVERITY: How bad is the pain?  (Scale 1-10; mild, moderate or severe)     5/10 4. URI SYMPTOMS: Do you have a runny nose or cough?     Nasal congestion, denies cough 5. FEVER: Do you have a fever? If Yes, ask: What is your temperature, how was it measured, and when did it start?     no  7. OTHER SYMPTOMS: Do you have any other symptoms? (e.g., decreased hearing, dizziness, headache, stiff neck, vomiting)     denies  Protocols used: Earache-A-AH  "

## 2024-01-31 NOTE — Telephone Encounter (Signed)
 FYI reviewed, pt has scheduled appt for tomorrow.

## 2024-02-01 ENCOUNTER — Encounter: Payer: Self-pay | Admitting: Family Medicine

## 2024-02-01 ENCOUNTER — Ambulatory Visit: Admitting: Family Medicine

## 2024-02-01 VITALS — BP 122/74 | HR 83 | Temp 98.2°F | Wt 162.0 lb

## 2024-02-01 DIAGNOSIS — H9201 Otalgia, right ear: Secondary | ICD-10-CM | POA: Diagnosis not present

## 2024-02-01 DIAGNOSIS — J019 Acute sinusitis, unspecified: Secondary | ICD-10-CM | POA: Diagnosis not present

## 2024-02-01 DIAGNOSIS — Z1231 Encounter for screening mammogram for malignant neoplasm of breast: Secondary | ICD-10-CM

## 2024-02-01 DIAGNOSIS — B9689 Other specified bacterial agents as the cause of diseases classified elsewhere: Secondary | ICD-10-CM | POA: Insufficient documentation

## 2024-02-01 MED ORDER — FLUTICASONE PROPIONATE 50 MCG/ACT NA SUSP: 1.0000 | Freq: Two times a day (BID) | NASAL | 0 refills | Status: AC | PRN

## 2024-02-01 MED ORDER — METHYLPREDNISOLONE ACETATE 80 MG/ML IJ SUSP
80.0000 mg | Freq: Once | INTRAMUSCULAR | Status: AC
  Administered 2024-02-01: 80 mg via INTRAMUSCULAR

## 2024-02-01 MED ORDER — AZITHROMYCIN 250 MG PO TABS: ORAL_TABLET | ORAL | 0 refills | Status: AC

## 2024-02-01 NOTE — Progress Notes (Signed)
 "      Diana Bryant , 25-Aug-1965, 58 y.o., female MRN: [id number] Patient Care Team    Relationship Specialty Notifications Start End  Catherine Charlies DELENA, DO PCP - General Family Medicine  03/09/21   Juventino Lamar HERO, MD (Inactive)  Cardiology  05/27/11   Shaaron Lamar HERO, MD Consulting Physician Gastroenterology  11/24/15   Carolee Sherwood JONETTA DOUGLAS, MD Consulting Physician Urology  03/06/21     Chief Complaint  Patient presents with   Ear Pain    Since Saturday; R ear. Associated sinus pressure and cough. Pt has tried Tylenol .      Subjective: Diana Bryant is a 58 y.o. Pt presents for an OV with complaints of b/l ear pain of 5-6 days duration.  Started as right ear pain and sinus congestion.  Associated symptoms include head pressure and severe nasal congestion.  Patient reports her head is severely congested and feels it is starting to go down her chest.  Pt has tried albuterol , hydration, rest and nasal saline to ease their symptoms.   Patient reports she has not seen any improvement in her symptoms and feels yesterday they started to worsen.     07/26/2023   11:51 AM 01/25/2022    2:56 PM 03/09/2021    9:53 AM 03/09/2021    9:52 AM  Depression screen PHQ 2/9  Decreased Interest 0 0 0 0  Down, Depressed, Hopeless 0 0 0 0  PHQ - 2 Score 0 0 0 0  Altered sleeping   1   Tired, decreased energy   0   Change in appetite   0   Feeling bad or failure about yourself    0   Trouble concentrating   0   Moving slowly or fidgety/restless   0   Suicidal thoughts   0   PHQ-9 Score   1       Data saved with a previous flowsheet row definition    Allergies[1] Social History   Social History Narrative   Marital status/children/pets: Married. G4P3- 4 daughters   Education/employment: some college. Retired-sales.    Safety:      -smoke alarm in the home:Yes     - wears seatbelt: Yes     - Feels safe in their relationships: Yes      Past Medical History:  Diagnosis Date   Abnormal  EKG    Long QT interval, corrected value of 480 ms   Allergies    Asthma    Chicken pox    Hemorrhoids 12/02/2015   History of kidney stones 04/21/2016   Dr. CHARLENA Carolee -- Uro   Hyperlipidemia    Lactose intolerance    Non-celiac gluten sensitivity    Palpitations    05/2011-normal CBC, normal BMet, normal EKG except borderline QT prolongation and   Seasonal allergies    UTI (urinary tract infection)    Past Surgical History:  Procedure Laterality Date   ABDOMINAL HYSTERECTOMY  1997   LAPAROSCOPIC APPENDECTOMY  02/1997   LIPOMA EXCISION  2018   Family History  Problem Relation Age of Onset   Dementia Mother        vascular   Prostate cancer Maternal Grandfather    Alcohol abuse Half-Brother    Drug abuse Half-Brother    Allergies as of 02/01/2024       Reactions   Amoxicillin Anaphylaxis   Has patient had a PCN reaction causing immediate rash, facial/tongue/throat swelling, SOB or lightheadedness with hypotension: {yes  Has patient had a PCN reaction causing severe rash involving mucus membranes or skin necrosis: {yes Has patient had a PCN reaction that required hospitalization {yes Has patient had a PCN reaction occurring within the last 10 years: yes If all of the above answers are NO, then may proceed with Cephalosporin use.   Levaquin [levofloxacin In D5w] Anaphylaxis   Sulfonamide Derivatives Anaphylaxis, Hives   Codeine    Iodinated Contrast Media Other (See Comments)   Milk-related Compounds    Oxycodone-acetaminophen  Hives   Shellfish Allergy         Medication List        Accurate as of February 01, 2024  8:28 AM. If you have any questions, ask your nurse or doctor.          albuterol  108 (90 Base) MCG/ACT inhaler Commonly known as: VENTOLIN  HFA Inhale 1-2 puffs into the lungs every 6 (six) hours as needed for wheezing or shortness of breath.   azithromycin  250 MG tablet Commonly known as: ZITHROMAX  Take 2 tablets on day 1, then 1 tablet daily  on days 2 through 5 Started by: Tavi Gaughran, DO   B-12 PO Take 1 tablet by mouth daily.   fluticasone  50 MCG/ACT nasal spray Commonly known as: FLONASE  Place 1 spray into both nostrils 2 (two) times daily as needed for allergies.   OVER THE COUNTER MEDICATION   PROBIOTIC-10 PO Take 1 tablet by mouth 2 (two) times daily.   QC TUMERIC COMPLEX PO Take 1 tablet by mouth daily.   UNABLE TO FIND Apply 1 application topically 2 (two) times daily. Med Name: beta yam   UNABLE TO FIND Take 1 tablet by mouth 2 (two) times daily. Med Name: stress care herbs   VITAMIN C PO Take 1 tablet by mouth daily.   VITAMIN D  PO Take 1 capsule by mouth daily.        All past medical history, surgical history, allergies, family history, immunizations andmedications were updated in the EMR today and reviewed under the history and medication portions of their EMR.     Review of Systems  Constitutional: Negative.   HENT:  Positive for congestion, ear pain and sinus pain.   Eyes: Negative.   Respiratory:  Positive for cough. Negative for sputum production, shortness of breath and wheezing.   Cardiovascular: Negative.   Gastrointestinal: Negative.  Negative for abdominal pain, diarrhea, nausea and vomiting.  Genitourinary: Negative.   Musculoskeletal: Negative.   Skin: Negative.  Negative for rash.  Neurological:  Positive for headaches. Negative for dizziness.  Endo/Heme/Allergies: Negative.   Psychiatric/Behavioral: Negative.    All other systems reviewed and are negative.  Negative, with the exception of above mentioned in HPI   Objective:  BP 122/74   Pulse 83   Temp 98.2 F (36.8 C)   Wt 162 lb (73.5 kg)   SpO2 97%   BMI 29.16 kg/m  Body mass index is 29.16 kg/m. Physical Exam Vitals and nursing note reviewed.  Constitutional:      General: She is not in acute distress.    Appearance: Normal appearance. She is normal weight. She is not ill-appearing or toxic-appearing.   HENT:     Head: Normocephalic and atraumatic.     Right Ear: Ear canal and external ear normal. A middle ear effusion is present. There is no impacted cerumen. Tympanic membrane is not erythematous, retracted or bulging.     Left Ear: Ear canal and external ear normal. A middle ear effusion  is present. There is no impacted cerumen. Tympanic membrane is not erythematous, retracted or bulging.     Nose: Congestion and rhinorrhea present.     Right Turbinates: Swollen.     Left Turbinates: Swollen.     Right Sinus: Maxillary sinus tenderness present.     Left Sinus: Maxillary sinus tenderness present.     Mouth/Throat:     Lips: No lesions.     Mouth: Mucous membranes are moist. No oral lesions.     Tongue: No lesions.     Pharynx: No oropharyngeal exudate, posterior oropharyngeal erythema or postnasal drip.  Eyes:     General: No scleral icterus.       Right eye: No discharge.        Left eye: No discharge.     Extraocular Movements: Extraocular movements intact.     Conjunctiva/sclera: Conjunctivae normal.     Pupils: Pupils are equal, round, and reactive to light.  Cardiovascular:     Rate and Rhythm: Normal rate and regular rhythm.     Heart sounds: No murmur heard. Pulmonary:     Effort: Pulmonary effort is normal. No respiratory distress.     Breath sounds: Normal breath sounds. No wheezing, rhonchi or rales.  Musculoskeletal:     Cervical back: Neck supple.  Lymphadenopathy:     Cervical: Cervical adenopathy present.  Skin:    Findings: No rash.  Neurological:     Mental Status: She is alert and oriented to person, place, and time. Mental status is at baseline.     Motor: No weakness.     Coordination: Coordination normal.     Gait: Gait normal.  Psychiatric:        Mood and Affect: Mood normal.        Behavior: Behavior normal.        Thought Content: Thought content normal.        Judgment: Judgment normal.    No results found. No results found. No results found  for this or any previous visit (from the past 24 hours).  Assessment/Plan: Diana Bryant is a 58 y.o. female present for OV for  Right ear pain/Acute bacterial sinusitis (Primary) Rest.  Hydrate. - methylPREDNISolone  acetate (DEPO-MEDROL ) injection 80 mg -Azithromycin  prescribed (he has worked in the past and patient has allergies to other first-line ABX) -Continue nasal saline flushes twice daily, followed by Flonase  application Follow-up in 1-2 weeks if symptoms are not improving.  Health maintenance: Breast cancer screening: Mammogram ordered at [facility] location  Reviewed expectations re: course of current medical issues. Discussed self-management of symptoms. Outlined signs and symptoms indicating need for more acute intervention. Patient verbalized understanding and all questions were answered. Patient received an After-Visit Summary.  Return for cpe (20 min).  Patient encouraged to schedule CPE   Orders Placed This Encounter  Procedures   MM 3D SCREENING MAMMOGRAM BILATERAL BREAST   Meds ordered this encounter  Medications   methylPREDNISolone  acetate (DEPO-MEDROL ) injection 80 mg   azithromycin  (ZITHROMAX ) 250 MG tablet    Sig: Take 2 tablets on day 1, then 1 tablet daily on days 2 through 5    Dispense:  6 tablet    Refill:  0   fluticasone  (FLONASE ) 50 MCG/ACT nasal spray    Sig: Place 1 spray into both nostrils 2 (two) times daily as needed for allergies.    Dispense:  16 mL    Refill:  0   Referral Orders  No referral(s) requested today  Note is dictated utilizing voice recognition software. Although note has been proof read prior to signing, occasional typographical errors still can be missed. If any questions arise, please do not hesitate to call for verification.   electronically signed by:  Charlies Bellini, DO  [facility] [place]       [1]  Allergies Allergen Reactions   Amoxicillin Anaphylaxis    Has patient had a PCN reaction  causing immediate rash, facial/tongue/throat swelling, SOB or lightheadedness with hypotension: {yes Has patient had a PCN reaction causing severe rash involving mucus membranes or skin necrosis: {yes Has patient had a PCN reaction that required hospitalization {yes Has patient had a PCN reaction occurring within the last 10 years: yes If all of the above answers are NO, then may proceed with Cephalosporin use.   Levaquin [Levofloxacin In D5w] Anaphylaxis   Sulfonamide Derivatives Anaphylaxis and Hives   Codeine    Iodinated Contrast Media Other (See Comments)   Milk-Related Compounds    Oxycodone-Acetaminophen  Hives   Shellfish Allergy    "

## 2024-02-01 NOTE — Patient Instructions (Addendum)
Return for cpe (20 min).        Great to see you today.  I have refilled the medication(s) we provide.   If labs were collected or images ordered, we will inform you of  results once we have received them and reviewed. We will contact you either by echart message, or telephone call.  Please give ample time to the testing facility, and our office to run,  receive and review results. Please do not call inquiring of results, even if you can see them in your chart. We will contact you as soon as we are able. If it has been over 1 week since the test was completed, and you have not yet heard from [place], then please call us.    - echart message- for normal results that have been seen by the patient already.   - telephone call: abnormal results or if patient has not viewed results in their echart.  If a referral to a specialist was entered for you, please call us in 2 weeks if you have not heard from the specialist office to schedule.

## 2024-03-28 IMAGING — MG MM DIGITAL SCREENING BILAT W/ TOMO AND CAD
8 series · 8 of 24 positions shown · non-contrast
Comparison: None available.

CLINICAL DATA: Screening.

EXAM:
DIGITAL SCREENING BILATERAL MAMMOGRAM WITH TOMOSYNTHESIS AND CAD
TECHNIQUE: Bilateral screening digital craniocaudal and mediolateral oblique
mammograms were obtained. Bilateral screening digital breast
tomosynthesis was performed. The images were evaluated with
computer-aided detection.

[L MLO synth-2D]
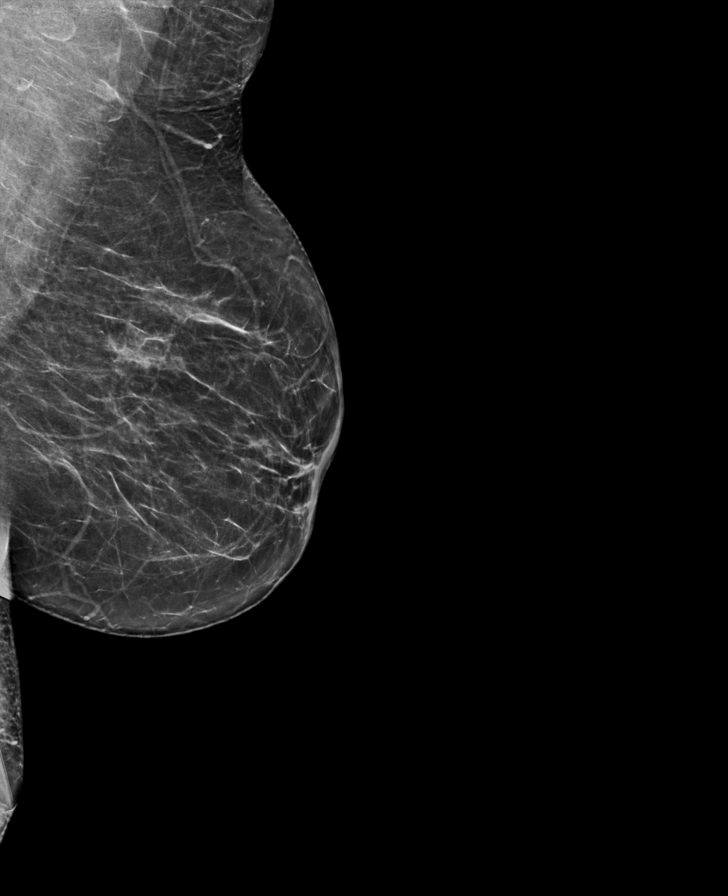

[L CC synth-2D]
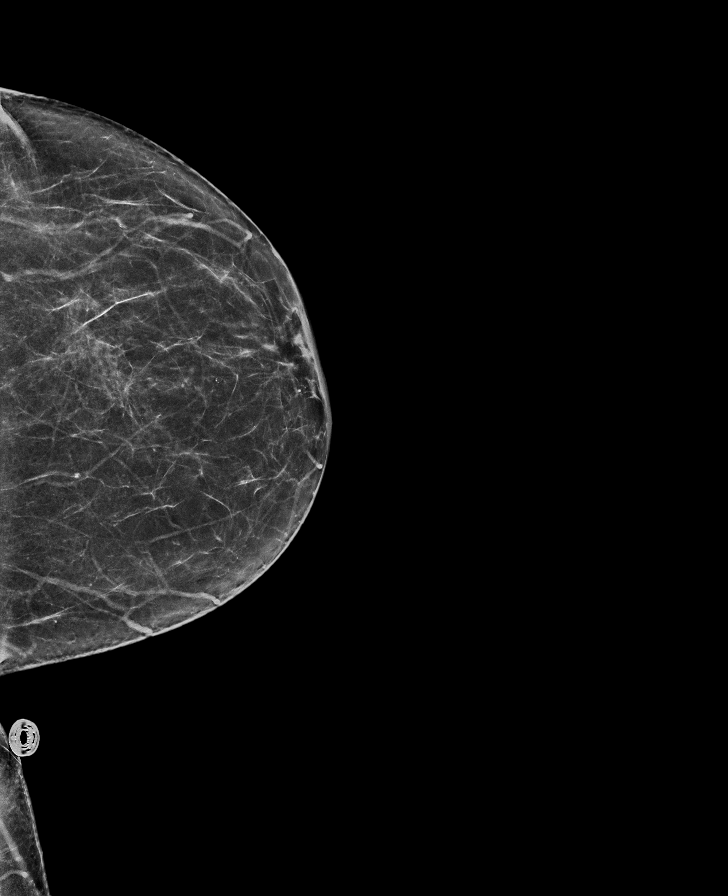

[R MLO synth-2D]
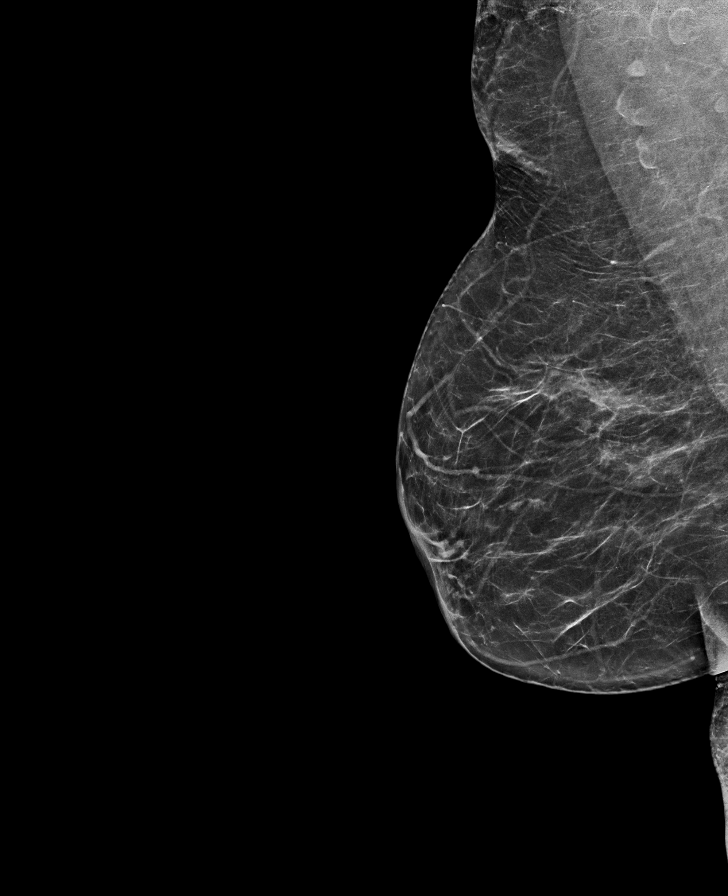

[R CC synth-2D]
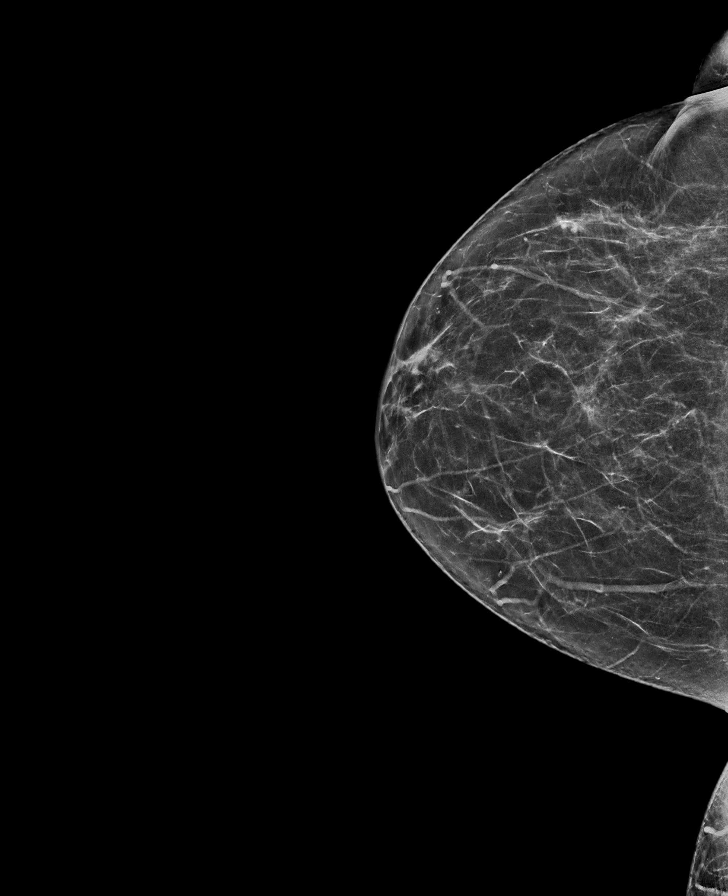

[R CC tomo · tomo slice 33/64.0]
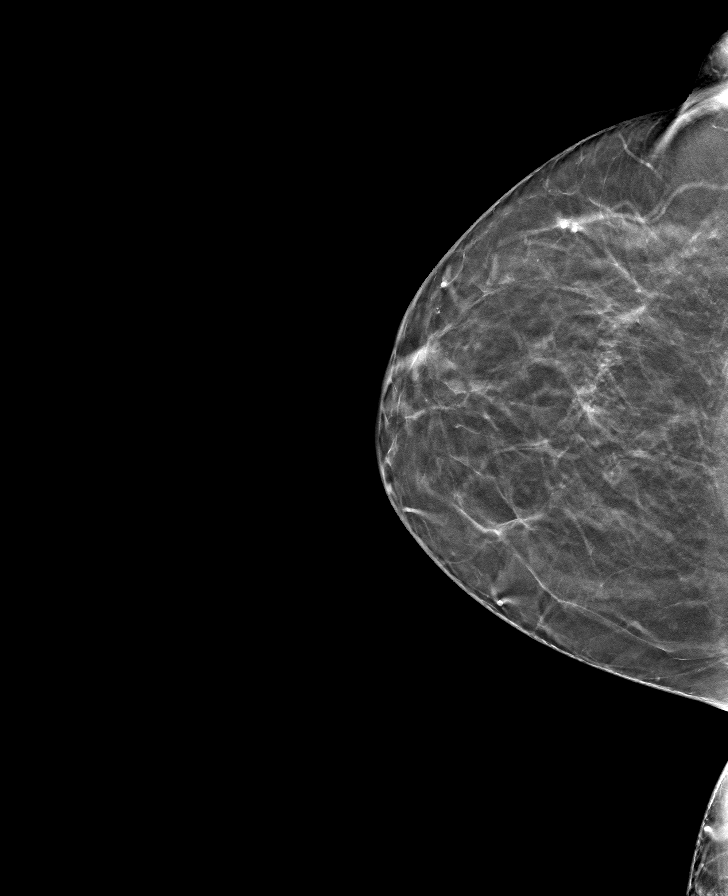

[L MLO tomo · tomo slice 36/71.0]
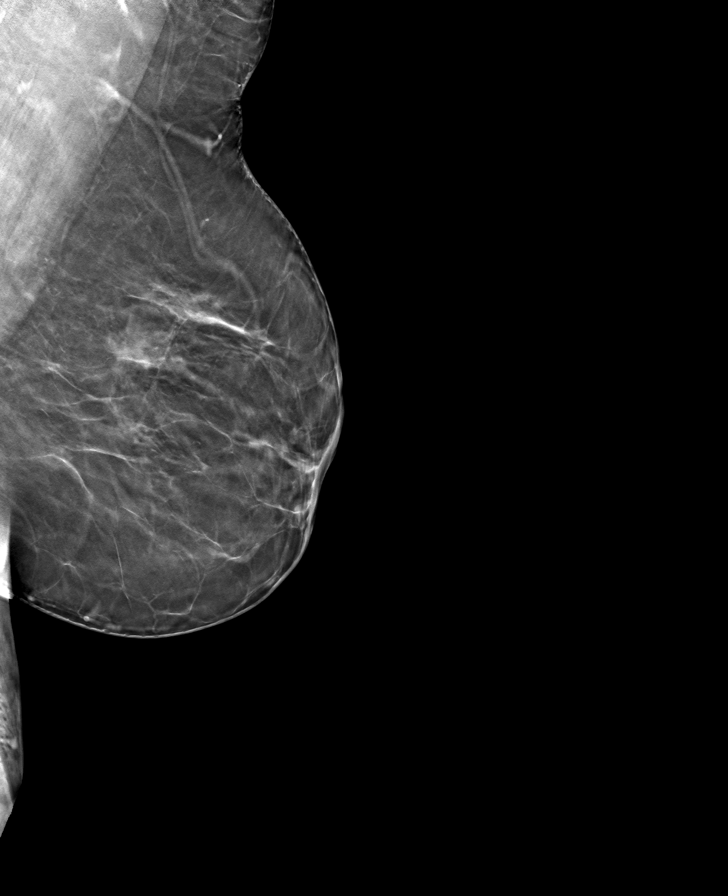

[L CC tomo · tomo slice 31/62.0]
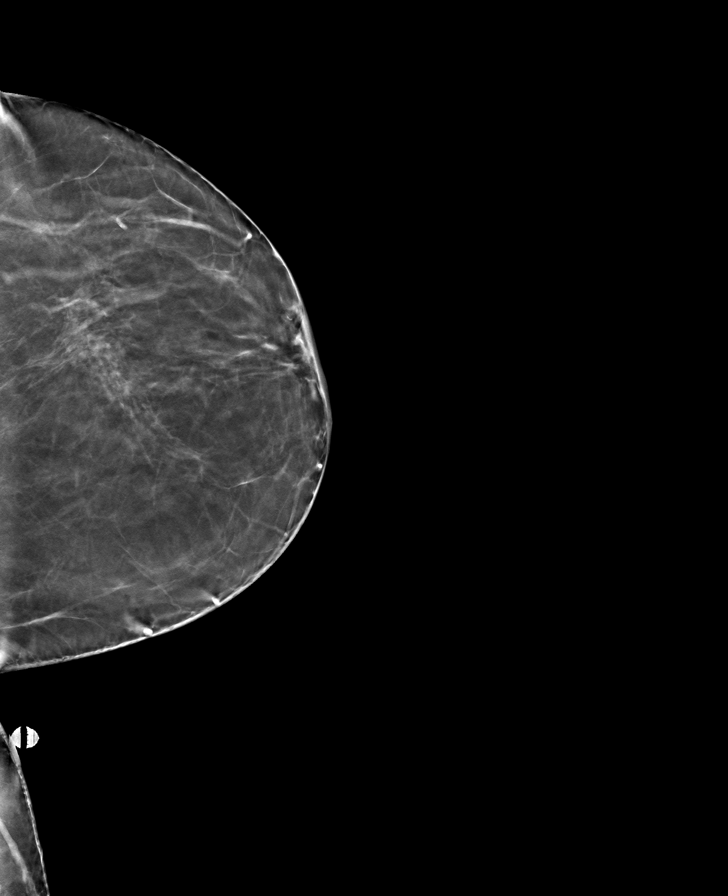

[R MLO tomo · tomo slice 35/70.0]
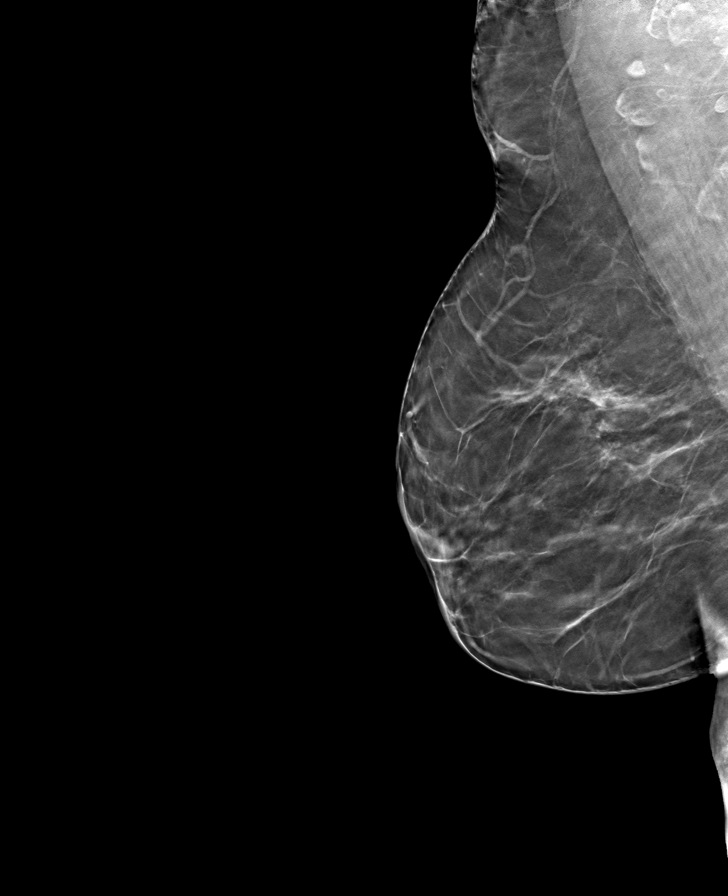

[8 of 24 positions shown; findings below may reference images not displayed]

ACR Breast Density Category b: There are scattered areas of
fibroglandular density.
FINDINGS: There are no findings suspicious for malignancy.
IMPRESSION: No mammographic evidence of malignancy. A result letter of this
screening mammogram will be mailed directly to the patient.

RECOMMENDATION:
Screening mammogram in one year. (Code:GB-Z-FIU)

BI-RADS CATEGORY  1: Negative.
